# Patient Record
Sex: Female | Born: 1945 | Race: Black or African American | Hispanic: No | Marital: Single | Smoking: Never smoker
Health system: Southern US, Community
[De-identification: ages and names within clinical notes are randomized; demographics above are authoritative.]

## PROBLEM LIST (undated history)

## (undated) DIAGNOSIS — I10 Essential (primary) hypertension: Secondary | ICD-10-CM

## (undated) DIAGNOSIS — E7800 Pure hypercholesterolemia, unspecified: Secondary | ICD-10-CM

## (undated) DIAGNOSIS — I519 Heart disease, unspecified: Secondary | ICD-10-CM

## (undated) DIAGNOSIS — B20 Human immunodeficiency virus [HIV] disease: Secondary | ICD-10-CM

## (undated) DIAGNOSIS — G4730 Sleep apnea, unspecified: Secondary | ICD-10-CM

## (undated) DIAGNOSIS — G8929 Other chronic pain: Secondary | ICD-10-CM

## (undated) DIAGNOSIS — M549 Dorsalgia, unspecified: Secondary | ICD-10-CM

## (undated) DIAGNOSIS — M4800 Spinal stenosis, site unspecified: Secondary | ICD-10-CM

## (undated) DIAGNOSIS — F418 Other specified anxiety disorders: Secondary | ICD-10-CM

## (undated) DIAGNOSIS — M1990 Unspecified osteoarthritis, unspecified site: Secondary | ICD-10-CM

## (undated) DIAGNOSIS — K219 Gastro-esophageal reflux disease without esophagitis: Secondary | ICD-10-CM

## (undated) DIAGNOSIS — E119 Type 2 diabetes mellitus without complications: Secondary | ICD-10-CM

## (undated) DIAGNOSIS — J45909 Unspecified asthma, uncomplicated: Secondary | ICD-10-CM

## (undated) DIAGNOSIS — E039 Hypothyroidism, unspecified: Secondary | ICD-10-CM

## (undated) DIAGNOSIS — F209 Schizophrenia, unspecified: Secondary | ICD-10-CM

## (undated) DIAGNOSIS — Z21 Asymptomatic human immunodeficiency virus [HIV] infection status: Secondary | ICD-10-CM

## (undated) HISTORY — DX: Essential (primary) hypertension: I10

## (undated) HISTORY — DX: Asymptomatic human immunodeficiency virus (hiv) infection status: Z21

## (undated) HISTORY — DX: Heart disease, unspecified: I51.9

## (undated) HISTORY — DX: Pure hypercholesterolemia, unspecified: E78.00

## (undated) HISTORY — DX: Type 2 diabetes mellitus without complications: E11.9

## (undated) HISTORY — DX: Schizophrenia, unspecified: F20.9

## (undated) HISTORY — DX: Unspecified asthma, uncomplicated: J45.909

## (undated) HISTORY — DX: Gastro-esophageal reflux disease without esophagitis: K21.9

## (undated) HISTORY — DX: Hypothyroidism, unspecified: E03.9

## (undated) HISTORY — DX: Sleep apnea, unspecified: G47.30

## (undated) HISTORY — PX: CORONARY ANGIOPLASTY WITH STENT PLACEMENT: SHX49

## (undated) HISTORY — PX: TUBAL LIGATION: SHX77

## (undated) HISTORY — PX: HERNIA REPAIR: SHX51

## (undated) HISTORY — DX: Human immunodeficiency virus (HIV) disease: B20

## (undated) HISTORY — DX: Other specified anxiety disorders: F41.8

---

## 2014-10-10 ENCOUNTER — Encounter: Payer: Self-pay | Admitting: Internal Medicine

## 2014-11-01 ENCOUNTER — Ambulatory Visit (INDEPENDENT_AMBULATORY_CARE_PROVIDER_SITE_OTHER): Payer: Medicare Other | Admitting: Gastroenterology

## 2014-11-01 ENCOUNTER — Other Ambulatory Visit: Payer: Self-pay

## 2014-11-01 ENCOUNTER — Encounter (INDEPENDENT_AMBULATORY_CARE_PROVIDER_SITE_OTHER): Payer: Self-pay

## 2014-11-01 ENCOUNTER — Encounter: Payer: Self-pay | Admitting: Gastroenterology

## 2014-11-01 VITALS — BP 142/85 | HR 97 | Temp 97.6°F | Ht 65.0 in | Wt 235.0 lb

## 2014-11-01 DIAGNOSIS — R112 Nausea with vomiting, unspecified: Secondary | ICD-10-CM | POA: Diagnosis not present

## 2014-11-01 DIAGNOSIS — R197 Diarrhea, unspecified: Secondary | ICD-10-CM | POA: Diagnosis not present

## 2014-11-01 MED ORDER — ONDANSETRON HCL 4 MG PO TABS: 4.0000 mg | ORAL_TABLET | Freq: Three times a day (TID) | ORAL | Status: DC

## 2014-11-01 MED ORDER — ELUXADOLINE 75 MG PO TABS: 1.0000 | ORAL_TABLET | Freq: Two times a day (BID) | ORAL | Status: DC

## 2014-11-01 NOTE — Progress Notes (Signed)
Primary Care Physician:  Aggie CosierBALAJI DESAI, MD Primary Gastroenterologist:  Dr. Jena Gaussourk   Chief Complaint  Patient presents with  . Nausea  . Diarrhea  . Emesis    HPI:   Karen Mueller is a 69 y.o. female presenting today at the request of her primary care physician secondary to persistent nausea.   N/V for approximately a year. Hx of IBS. Diarrhea predominant. Colonoscopy in 2010 by Dr. Samuella CotaPandya. EGD several years ago as well. Intermittent symptoms. Gets nauseated moreso in the evenings. Solid food dysphagia, chronic. No weight loss. Appetite good. If doesn't eat in the morning, goes on eating spree in the evenings. When gets nervous or excited, food keeps coming up. Dexilant once daily. Ibuprofen routinely for pain.   Lower abdominal cramping associated with loose stools, improved with defecation. CHRONIC. No rectal bleeding. Diarrhea worsens after getting upset or excited. Diarrhea hits "anytime". Hardly ever a solid stool. Imodium has helped with loose stools in the past.  Past Medical History  Diagnosis Date  . Diabetes   . Hypercholesterolemia   . Hypertension   . GERD (gastroesophageal reflux disease)   . Sleep apnea   . Asthma   . Heart disease   . HIV (human immunodeficiency virus infection)   . Hypothyroidism     Past Surgical History  Procedure Laterality Date  . Coronary angioplasty with stent placement      X 2  . Tubal ligation    . Hernia repair      Current Outpatient Prescriptions  Medication Sig Dispense Refill  . benztropine (COGENTIN) 1 MG tablet 1 mg    . dexlansoprazole (DEXILANT) 60 MG capsule Take by mouth.    . hydrOXYzine (VISTARIL) 50 MG capsule 50 mg    . ibuprofen (ADVIL,MOTRIN) 800 MG tablet Take 800 mg by mouth every 8 (eight) hours as needed.    . loperamide (IMODIUM) 2 MG capsule Take 2 mg by mouth as needed for diarrhea or loose stools.    . metFORMIN (GLUCOPHAGE) 500 MG tablet     . metoprolol tartrate (LOPRESSOR) 25 MG tablet Take  by mouth.    . mirabegron ER (MYRBETRIQ) 50 MG TB24 tablet Take 50 mg by mouth daily.    . QUEtiapine (SEROQUEL) 200 MG tablet Take with a 400 mg tablet bedtime    . QUEtiapine (SEROQUEL) 400 MG tablet Take 400 mg by mouth nightly as needed,    . ramelteon (ROZEREM) 8 MG tablet Take by mouth.    . sertraline (ZOLOFT) 25 MG tablet     . sitaGLIPtin (JANUVIA) 100 MG tablet Take 100 mg by mouth daily.    . SUSTIVA 600 MG tablet     . tiZANidine (ZANAFLEX) 4 MG tablet Take by mouth.    . Eluxadoline (VIBERZI) 75 MG TABS Take 1 tablet by mouth 2 (two) times daily with a meal. 60 tablet 3  . ondansetron (ZOFRAN) 4 MG tablet Take 1 tablet (4 mg total) by mouth 3 (three) times daily before meals. 90 tablet 3   No current facility-administered medications for this visit.    Allergies as of 11/01/2014 - Review Complete 11/01/2014  Allergen Reaction Noted  . Metronidazole Other (See Comments) 11/01/2014  . Other Other (See Comments) 11/01/2014    Family History  Problem Relation Age of Onset  . Colon cancer Neg Hx     History   Social History  . Marital Status: Unknown    Spouse Name: N/A  .  Number of Children: N/A  . Years of Education: N/A   Occupational History  . Not on file.   Social History Main Topics  . Smoking status: Never Smoker   . Smokeless tobacco: Not on file  . Alcohol Use: No  . Drug Use: No  . Sexual Activity: Not on file   Other Topics Concern  . Not on file   Social History Narrative  . No narrative on file    Review of Systems: Gen: see HPI CV: Denies chest pain, heart palpitations, peripheral edema, syncope.  Resp: +SOB, DOE GI: see HPI GU : Denies urinary burning, urinary frequency, urinary hesitancy MS: +back pain, uses walker for ambulation Derm: Denies rash, itching, dry skin Psych: +depression/anxiety Heme: Denies bruising, bleeding, and enlarged lymph nodes.  Physical Exam: BP 142/85 mmHg  Pulse 97  Temp(Src) 97.6 F (36.4 C) (Oral)   Ht 5\' 5"  (1.651 m)  Wt 235 lb (106.595 kg)  BMI 39.11 kg/m2 General:   Alert and oriented. Pleasant and cooperative. Well-nourished and well-developed.  Head:  Normocephalic and atraumatic. Eyes:  Without icterus, sclera clear and conjunctiva pink.  Ears:  Normal auditory acuity. Nose:  No deformity, discharge,  or lesions. Mouth:  Upper palate edentulous  Lungs:  Clear to auscultation bilaterally. No wheezes, rales, or rhonchi. No distress.  Heart:  S1, S2 present without murmurs appreciated.  Abdomen:  +BS, soft, non-tender and non-distended. No HSM noted. No guarding or rebound. No masses appreciated. Obese.  Rectal:  Deferred  Msk:  Symmetrical without gross deformities. Normal posture. Extremities:  Without  edema. Neurologic:  Alert and  oriented x4;  grossly normal neurologically. Skin:  Intact without significant lesions or rashes. Psych:  Alert and cooperative. Normal mood and affect.

## 2014-11-01 NOTE — Patient Instructions (Addendum)
Start taking Viberzi twice a day with food. We have provided samples and sent a prescription. This is for abdominal cramping, diarrhea.   Start taking Zofran scheduled 20-30 minutes before your meal. This is a nausea medication. Please review the diet attached.   We have scheduled you for an upper endoscopy with dilation with Dr. Jena Gaussourk in the near future.   I would like to see you back in 3 months.

## 2014-11-06 ENCOUNTER — Telehealth: Payer: Self-pay

## 2014-11-06 NOTE — Telephone Encounter (Signed)
Received a fax from the pharmacy. pts insurance will not cover viberzi. It is a non-formulary drug.

## 2014-11-06 NOTE — Telephone Encounter (Signed)
Pt is calling because the Viberzi is making her vomit. SHe does not want to take anymore since she is throwing up.Please advise

## 2014-11-07 MED ORDER — DICYCLOMINE HCL 10 MG PO CAPS: 10.0000 mg | ORAL_CAPSULE | Freq: Three times a day (TID) | ORAL | Status: DC

## 2014-11-07 NOTE — Telephone Encounter (Signed)
Pt is better since stopping the Viberzi. She is aware of the new Rx for the diarrhea

## 2014-11-07 NOTE — Telephone Encounter (Signed)
Note faxed to the pharmacy.

## 2014-11-07 NOTE — Telephone Encounter (Signed)
Stop Viberzi now.   If she still has vomiting after stopping, needs stat CMP, lipase. Any abdominal pain?   Zofran scheduled with meals as per last appt. Will send in Bentyl to take with meals for diarrhea.

## 2014-11-07 NOTE — Telephone Encounter (Signed)
Stopped Viberzi. Patient having N/V.

## 2014-11-10 DIAGNOSIS — A0472 Enterocolitis due to Clostridium difficile, not specified as recurrent: Secondary | ICD-10-CM | POA: Insufficient documentation

## 2014-11-10 DIAGNOSIS — R112 Nausea with vomiting, unspecified: Secondary | ICD-10-CM | POA: Insufficient documentation

## 2014-11-10 NOTE — Assessment & Plan Note (Signed)
69 year old female wit90h nausea and vomiting for approximately a year in the setting of diabetes, raising the question of delayed gastric emptying. Associated solid food dysphagia, chronic, with last EGD several years ago by Dr. Samuella CotaPandya. Unknown if any previous dilations performed. Query uncontrolled GERD, web, ring, or stricture. Doubt occult malignancy in this setting.  Proceed with upper endoscopy and dilation  in the near future with Dr. Jena Gaussourk. The risks, benefits, and alternatives have been discussed in detail with patient. They have stated understanding and desire to proceed.  PROPOFOL due to polypharmacy Continue Dexilant once daily

## 2014-11-10 NOTE — Assessment & Plan Note (Signed)
Chronic, with history of IBS. Last colonoscopy several years ago by Dr. Samuella CotaPandya. No concerning lower GI symptoms. Will retrieve last colonoscopy reports and trial Viberzi 75 mg BID.

## 2014-11-15 ENCOUNTER — Encounter (HOSPITAL_COMMUNITY)
Admission: RE | Admit: 2014-11-15 | Discharge: 2014-11-15 | Disposition: A | Discharge status: Home / Self Care | Payer: Medicare Other | Source: Ambulatory Visit | Attending: Internal Medicine | Admitting: Internal Medicine

## 2014-11-18 ENCOUNTER — Telehealth: Payer: Self-pay

## 2014-11-18 NOTE — Telephone Encounter (Signed)
Karen JonesCarolyn called because she did not show for Prep-Op appointment.They tried to call her but the numbers in the computer are not working. I tried to call also but no answer.

## 2014-11-19 NOTE — Telephone Encounter (Signed)
Noted. Will need to cancel procedure for now and have her come in for an OV.

## 2014-11-19 NOTE — Telephone Encounter (Signed)
I have tried to call but still no answer

## 2014-11-21 ENCOUNTER — Ambulatory Visit (HOSPITAL_COMMUNITY): Admission: RE | Admit: 2014-11-21 | Payer: Medicare Other | Source: Ambulatory Visit | Admitting: Internal Medicine

## 2014-11-21 ENCOUNTER — Encounter (HOSPITAL_COMMUNITY): Admission: RE | Payer: Self-pay | Source: Ambulatory Visit

## 2014-11-21 SURGERY — ESOPHAGOGASTRODUODENOSCOPY (EGD) WITH PROPOFOL
Anesthesia: Monitor Anesthesia Care

## 2014-11-29 NOTE — Progress Notes (Signed)
cc'ed to pcp °

## 2014-12-31 ENCOUNTER — Ambulatory Visit (INDEPENDENT_AMBULATORY_CARE_PROVIDER_SITE_OTHER): Payer: Medicare Other | Admitting: Gastroenterology

## 2014-12-31 ENCOUNTER — Other Ambulatory Visit: Payer: Self-pay

## 2014-12-31 ENCOUNTER — Encounter: Payer: Self-pay | Admitting: Gastroenterology

## 2014-12-31 VITALS — BP 126/77 | HR 97 | Temp 97.3°F | Ht 65.0 in | Wt 227.4 lb

## 2014-12-31 DIAGNOSIS — R197 Diarrhea, unspecified: Secondary | ICD-10-CM | POA: Diagnosis not present

## 2014-12-31 DIAGNOSIS — R112 Nausea with vomiting, unspecified: Secondary | ICD-10-CM

## 2014-12-31 DIAGNOSIS — K625 Hemorrhage of anus and rectum: Secondary | ICD-10-CM | POA: Diagnosis not present

## 2014-12-31 MED ORDER — PEG 3350-KCL-NA BICARB-NACL 420 G PO SOLR: 4000.0000 mL | ORAL | Status: DC

## 2014-12-31 NOTE — Assessment & Plan Note (Addendum)
69 year old female with intermittent N/V for the past year in the setting of diabetes, raising the question of delayed gastric emptying. Associated solid food dysphagia, chronic, with reportedly an EGD several years ago by Dr. Samuella CotaPandya. Requesting records again. Poor historian. Query uncontrolled GERD, web, ring, stricture. Doubt occult malignancy as she has no other concerning symptoms. May ultimately need GES. Ibuprofen routinely may be contributing to clinical picture, leading to gastritis, +/- PUD.   Proceed with upper endoscopy and dilation in the near future with Dr. Jena Gaussourk. The risks, benefits, and alternatives have been discussed in detail with patient. They have stated understanding and desire to proceed.  PROPOFOL DUE TO POLYPHARMACY Continue Dexilant once daily Consider GES Patient is on plavix

## 2014-12-31 NOTE — Assessment & Plan Note (Signed)
Chronic, reported history of IBS. Now with rectal bleeding. Intolerant to [employer], stating it worsened N/V. Continue Bentyl, which has only shown minimal improvement. Colonoscopy as planned. Hold on stool studies as this is chronic and unchanged. Further recommendations after colonoscopy.

## 2014-12-31 NOTE — Assessment & Plan Note (Addendum)
In setting of chronic diarrhea. Last colonoscopy several years ago by Dr. Samuella CotaPandya per patient; however, she is a poor historian. Unable to retreive any records. Due to new onset rectal bleeding, will proceed with colonoscopy at time of EGD. Likely benign anorectal source.  Proceed with TCS with Dr. Jena Gaussourk in near future: the risks, benefits, and alternatives have been discussed with the patient in detail. The patient states understanding and desires to proceed. PROPOFOL DUE TO POLYPHARMACY Patient is on plavix. Continue as risks of stopping plavix outweigh benefits.

## 2014-12-31 NOTE — Patient Instructions (Signed)
We have scheduled you for a colonoscopy, upper endoscopy, and dilation with Dr. Jena Gaussourk in the near future.   Please continue Dexilant once daily each morning for reflux and Bentyl with meals and bedtime for loose stool and belly cramping.   Further recommendations to follow!

## 2014-12-31 NOTE — Progress Notes (Signed)
Referring Provider: Aggie Cosieresai, Balaji, MD Primary Care Physician:  Aggie CosierBALAJI DESAI, MD  Primary GI: Dr. Jena Gaussourk   Chief Complaint  Patient presents with  . set up EGD    HPI:   Karen Mueller is a 69 y.o. female presenting today with a history of IBS, diarrhea predominant, with last colonoscopy reportedly in 2010 by Dr. Samuella CotaPandya. We have requested records. No concerning lower GI symptoms. Notable history of N/V for the past year. Last seen in May 2016 and scheduled for an EGD and dilation; however, she did not show for her pre-op appointment. Procedure was cancelled. [facility]. Missed her medications. Hallucinating at that time. History of schizophrenia. Doing well now.   States appetite is good. Nausea worse in the evenings. Intermittent N/V, sometimes "food comes up if excited". Dexilant once daily. Ibuprofen routinely for pain. Solid food dysphagia noted, chronic. Possible EGD several years ago by Dr. Samuella CotaPandya. Records requested.    Unable to tolerate Viberzi, stating it worsened N/V. Started on Bentyl. States "none of it is helping". Chronic diarrhea. States she has seen some low-volume hematochezia intermittently, which is new since last appt. Vague abdominal discomfort, crampy. Poor historian. Relieved with defecation.   Past Medical History  Diagnosis Date  . Diabetes   . Hypercholesterolemia   . Hypertension   . GERD (gastroesophageal reflux disease)   . Sleep apnea   . Asthma   . Heart disease   . HIV (human immunodeficiency virus infection)   . Hypothyroidism   . Schizophrenia   . Depression with anxiety     Past Surgical History  Procedure Laterality Date  . Coronary angioplasty with stent placement      X 2  . Tubal ligation    . Hernia repair      Current Outpatient Prescriptions  Medication Sig Dispense Refill  . aspirin 81 MG tablet Take 81 mg by mouth daily.    . benztropine (COGENTIN) 1 MG tablet 1 mg    .  dexlansoprazole (DEXILANT) 60 MG capsule Take by mouth.    . dicyclomine (BENTYL) 10 MG capsule Take 1 capsule (10 mg total) by mouth 4 (four) times daily -  before meals and at bedtime. 120 capsule 3  . hydrOXYzine (VISTARIL) 50 MG capsule 50 mg    . ibuprofen (ADVIL,MOTRIN) 800 MG tablet Take 800 mg by mouth every 8 (eight) hours as needed.    . loperamide (IMODIUM) 2 MG capsule Take 2 mg by mouth as needed for diarrhea or loose stools.    . metFORMIN (GLUCOPHAGE) 500 MG tablet     . metoprolol tartrate (LOPRESSOR) 25 MG tablet Take by mouth.    . mirabegron ER (MYRBETRIQ) 50 MG TB24 tablet Take 50 mg by mouth daily.    . Multiple Vitamins-Minerals (CERTA PLUS) TABS Take 1 tablet by mouth daily.    . QUEtiapine (SEROQUEL) 200 MG tablet Take with a 400 mg tablet bedtime    . QUEtiapine (SEROQUEL) 400 MG tablet Take 400 mg by mouth nightly as needed,    . sertraline (ZOLOFT) 25 MG tablet     . sitaGLIPtin (JANUVIA) 100 MG tablet Take 100 mg by mouth daily.    . SUSTIVA 600 MG tablet     . tiZANidine (ZANAFLEX) 4 MG tablet Take by mouth.    . ondansetron (ZOFRAN) 4 MG tablet Take 1 tablet (4 mg total) by mouth 3 (three) times daily before meals. (Patient  not taking: Reported on 12/31/2014) 90 tablet 3  . ramelteon (ROZEREM) 8 MG tablet Take by mouth.     No current facility-administered medications for this visit.    Allergies as of 12/31/2014 - Review Complete 12/31/2014  Allergen Reaction Noted  . Metronidazole Other (See Comments) 11/01/2014  . Other Other (See Comments) 11/01/2014    Family History  Problem Relation Age of Onset  . Colon cancer Neg Hx     History   Social History  . Marital Status: Single    Spouse Name: N/A  . Number of Children: N/A  . Years of Education: N/A   Social History Main Topics  . Smoking status: Never Smoker   . Smokeless tobacco: Not on file  . Alcohol Use: No  . Drug Use: No  . Sexual Activity: Not on file   Other Topics Concern  . None    Social History Narrative    Review of Systems: Gen: see HPI CV: occasional palpitations Resp: +Sob, +DOE GI: see HPI MS: +back pain, uses walker for ambulation Derm: Denies rash, itching, dry skin Psych: +depression/anxiety Heme: Denies bruising, bleeding, and enlarged lymph nodes.  Physical Exam: BP 126/77 mmHg  Pulse 97  Temp(Src) 97.3 F (36.3 C)  Ht  (1.651 m)  Wt 227 lb 6.4 oz (103.148 kg)  BMI 37.84 kg/m2 General:   Alert and oriented. No distress noted. Pleasant and cooperative.  Head:  Normocephalic and atraumatic. Eyes:  Conjuctiva clear without scleral icterus. Mouth:  Oral mucosa pink and moist. Edentulous upper palate Heart:  S1, S2 present without murmurs, rubs, or gallops. Regular rate and rhythm. Abdomen:  +BS, soft, mild TTP umbilicus, query umbilical hernia.  and non-distended. No rebound or guarding. Difficult to appreciate any HSM due to large AP diameter.  Msk:  Symmetrical without gross deformities. Stooped posture due to chronic back pain, knee pain.  Extremities:  Without edema. Neurologic:  Alert and  oriented x4;  grossly normal neurologically. Psych:  Alert and cooperative. Normal mood and affect.

## 2014-12-31 NOTE — Progress Notes (Signed)
cc'ed to pcp °

## 2015-01-10 NOTE — Patient Instructions (Signed)
Isaly Fasching  01/10/2015     @PREFPERIOPPHARMACY @   Your procedure is scheduled on  7/21/206  Report to [facility] at  1130  A.M.  Call this number if you have problems the morning of surgery:  [phone]   Remember:  Do not eat food or drink liquids after midnight.  Take these medicines the morning of surgery with A SIP OF WATER : cogentin, dexilant, neurontin, vistaril,metoprolol, myrebetriq, singulair, zofran, zoloft, diovan, sustiva, zanaflex. Take your nebulizer and your inhaler before you come. DO NOT take any medicines for diabetes the morning of surgery.   Do not wear jewelry, make-up or nail polish.  Do not wear lotions, powders, or perfumes.    Do not shave 48 hours prior to surgery.  Men may shave face and neck.  Do not bring valuables to the hospital.  [facility] is not responsible for any belongings or valuables.  Contacts, dentures or bridgework may not be worn into surgery.  Leave your suitcase in the car.  After surgery it may be brought to your room.  For patients admitted to the hospital, discharge time will be determined by your treatment team.  Patients discharged the day of surgery will not be allowed to drive home.   Name and phone number of your driver:   family Special instructions:  Follow your prep and diet instructions given to you by Dr Luvenia Starch office.  Please read over the following fact sheets that you were given. Pain Booklet, Coughing and Deep Breathing, Surgical Site Infection Prevention, Anesthesia Post-op Instructions and Care and Recovery After Surgery      Esophagogastroduodenoscopy Esophagogastroduodenoscopy (EGD) is a procedure to examine the lining of the esophagus, stomach, and first part of the small intestine (duodenum). A long, flexible, lighted tube with a camera attached (endoscope) is inserted down the throat to view these organs. This procedure is done to detect problems or abnormalities, such as inflammation,  bleeding, ulcers, or growths, in order to treat them. The procedure lasts about 5-20 minutes. It is usually an outpatient procedure, but it may need to be performed in emergency cases in the hospital. LET YOUR CAREGIVER KNOW ABOUT:   Allergies to food or medicine.  All medicines you are taking, including vitamins, herbs, eyedrops, and over-the-counter medicines and creams.  Use of steroids (by mouth or creams).  Previous problems you or members of your family have had with the use of anesthetics.  Any blood disorders you have.  Previous surgeries you have had.  Other health problems you have.  Possibility of pregnancy, if this applies. RISKS AND COMPLICATIONS  Generally, EGD is a safe procedure. However, as with any procedure, complications can occur. Possible complications include:  Infection.  Bleeding.  Tearing (perforation) of the esophagus, stomach, or duodenum.  Difficulty breathing or not being able to breath.  Excessive sweating.  Spasms of the larynx.  Slowed heartbeat.  Low blood pressure. BEFORE THE PROCEDURE  Do not eat or drink anything for 6-8 hours before the procedure or as directed by your caregiver.  Ask your caregiver about changing or stopping your regular medicines.  If you wear dentures, be prepared to remove them before the procedure.  Arrange for someone to drive you home after the procedure. PROCEDURE   A vein will be accessed to give medicines and fluids. A medicine to relax you (sedative) and a pain reliever will be given through that access into the vein.  A numbing medicine (local anesthetic) may be sprayed on your throat for comfort and to stop you from gagging or coughing.  A mouth guard may be placed in your mouth to protect your teeth and to keep you from biting on the endoscope.  You will be asked to lie on your left side.  The endoscope is inserted down your throat and into the esophagus, stomach, and duodenum.  Air is put  through the endoscope to allow your caregiver to view the lining of your esophagus clearly.  The esophagus, stomach, and duodenum is then examined. During the exam, your caregiver may:  Remove tissue to be examined under a microscope (biopsy) for inflammation, infection, or other medical problems.  Remove growths.  Remove objects (foreign bodies) that are stuck.  Treat any bleeding with medicines or other devices that stop tissues from bleeding (hot cautery, clipping devices).  Widen (dilate) or stretch narrowed areas of the esophagus and stomach.  The endoscope will then be withdrawn. AFTER THE PROCEDURE  You will be taken to a recovery area to be monitored. You will be able to go home once you are stable and alert.  Do not eat or drink anything until the local anesthetic and numbing medicines have worn off. You may choke.  It is normal to feel bloated, have pain with swallowing, or have a sore throat for a short time. This will wear off.  Your caregiver should be able to discuss his or her findings with you. It will take longer to discuss the test results if any biopsies were taken. Document Released: 10/15/2004 Document Revised: 10/29/2013 Document Reviewed: 05/17/2012 [facility] Patient Information 2015 [place], [place]. This information is not intended to replace advice given to you by your health care provider. Make sure you discuss any questions you have with your health care provider. Esophageal Dilatation The esophagus is the long, narrow tube which carries food and liquid from the mouth to the stomach. Esophageal dilatation is the technique used to stretch a blocked or narrowed portion of the esophagus. This procedure is used when a part of the esophagus has become so narrow that it becomes difficult, painful or even impossible to swallow. This is generally an uncomplicated form of treatment. When this is not successful, chest surgery may be required. This is a much more extensive  form of treatment with a longer recovery time. CAUSES  Some of the more common causes of blockage or strictures of the esophagus are:  Narrowing from longstanding inflammation (soreness and redness) of the lower esophagus. This comes from the constant exposure of the lower esophagus to the acid which bubbles up from the stomach. Over time this causes scarring and narrowing of the lower esophagus.  Hiatal hernia in which a small part of the stomach bulges (herniates) up through the diaphragm. This can cause a gradual narrowing of the end of the esophagus.  Schatzki ring is a narrow ring of benign (non-cancerous) fibrous tissue which constricts the lower esophagus. The reason for this is not known.  Scleroderma is a connective tissue disorder that affects the esophagus and makes swallowing difficult.  Achalasia is an absence of nerves to the lower esophagus and to the esophageal sphincter. This is the circular muscle between the stomach and esophagus that relaxes to allow food into the stomach. After swallowing, it contracts to keep food in the stomach. This absence of nerves may be congenital (present since birth). This can cause irregular spasms of the lower esophageal muscle. This spasm does  not open up to allow food and fluid through. The result is a persistent blockage with subsequent slow trickling of the esophageal contents into the stomach.  Strictures may develop from swallowing materials which damage the esophagus. Some examples are strong acids or alkalis such as lye.  Growths such as benign (non-cancerous) and malignant (cancerous) tumors can block the esophagus.  Hereditary (present since birth) causes. DIAGNOSIS  Your caregiver often suspects this problem by taking a medical history. They will also do a physical exam. They can then prove their suspicions using X-rays and endoscopy. Endoscopy is an exam in which a tube like a small, flexible telescope is used to look at your esophagus.   TREATMENT There are different stretching (dilating) techniques that can be used. Simple bougie dilatation may be done in the office. This usually takes only a couple minutes. A numbing (anesthetic) spray of the throat is used. Endoscopy, when done, is done in an endoscopy suite under mild sedation. When fluoroscopy is used, the procedure is performed in X-ray. Other techniques require a little longer time. Recovery is usually quick. There is no waiting time to begin eating and drinking to test success of the treatment. Following are some of the methods used. Narrowing of the esophagus is treated by making it bigger. Commonly this is a mechanical problem which can be treated with stretching. This can be done in different ways. Your caregiver will discuss these with you. Some of the means used are:  A series of graduated (increasing thickness) flexible dilators can be used. These are weighted tubes passed through the esophagus into the stomach. The tubes used become progressively larger until the desired stretched size is reached. Graduated dilators are a simple and quick way of opening the esophagus. No visualization is required.  Another method is the use of endoscopy to place a flexible wire across the stricture. The endoscope is removed and the wire left in place. A dilator with a hole through it from end to end is guided down the esophagus and across the stricture. One or more of these dilators are passed over the wire. At the end of the exam, the wire is removed. This type of treatment may be performed in the X-ray department under fluoroscopy. An advantage of this procedure is the examiner is visualizing the end opening in the esophagus.  Stretching of the esophagus may be done using balloons. Deflated balloons are placed through the endoscope and across the stricture. This type of balloon dilatation is often done at the time of endoscopy or fluoroscopy. Flexible endoscopy allows the examiner to  directly view the stricture. A balloon is inserted in the deflated form into the area of narrowing. It is then inflated with air to a certain pressure that is preset for a given circumference. When inflated, it becomes sausage shaped, stretched, and makes the stricture larger.  Achalasia requires a longer, larger balloon-type dilator. This is frequently done under X-ray control. In this situation, the spastic muscle fibers in the lower esophagus are stretched. All of the above procedures make the passage of food and water into the stomach easier. They also make it easier for stomach contents to reflux back into the esophagus. Special medications may be used following the procedure to help prevent further stricturing. Proton-pump inhibitor medications are good at decreasing the amount of acid in the stomach juice. When stomach juice refluxes into the esophagus, the juice is no longer as acidic and is less likely to burn or scar the esophagus.  RISKS AND COMPLICATIONS Esophageal dilatation is usually performed effectively and without problems. Some complications that can occur are:  A small amount of bleeding almost always happens where the stretching takes place. If this is too excessive it may require more aggressive treatment.  An uncommon complication is perforation (making a hole) of the esophagus. The esophagus is thin. It is easy to make a hole in it. If this happens, an operation may be necessary to repair this.  A small, undetected perforation could lead to an infection in the chest. This can be very serious. HOME CARE INSTRUCTIONS   If you received sedation for your procedure, do not drive, make important decisions, or perform any activities requiring your full coordination. Do not drink alcohol, take sedatives, or use any mind altering chemicals unless instructed by your caregiver.  You may use throat lozenges or warm salt water gargles if you have throat discomfort.  You can begin eating  and drinking normally on return home unless instructed otherwise. Do not purposely try to force large chunks of food down to test the benefits of your procedure.  Mild discomfort can be eased with sips of ice water.  Medications for discomfort may or may not be needed. SEEK IMMEDIATE MEDICAL CARE IF:   You begin vomiting up blood.  You develop black, tarry stools.  You develop chills or an unexplained temperature of over 101F (38.3C)  You develop chest or abdominal pain.  You develop shortness of breath, or feel light-headed or faint.  Your swallowing is becoming more painful, difficult, or you are unable to swallow. MAKE SURE YOU:   Understand these instructions.  Will watch your condition.  Will get help right away if you are not doing well or get worse. Document Released: 08/05/2005 Document Revised: 10/29/2013 Document Reviewed: 09/22/2005 [facility] Patient Information 2015 [place], [place]. This information is not intended to replace advice given to you by your health care provider. Make sure you discuss any questions you have with your health care provider. Colonoscopy A colonoscopy is an exam to look at the entire large intestine (colon). This exam can help find problems such as tumors, polyps, inflammation, and areas of bleeding. The exam takes about 1 hour.  LET [facility] CARE PROVIDER KNOW ABOUT:   Any allergies you have.  All medicines you are taking, including vitamins, herbs, eye drops, creams, and over-the-counter medicines.  Previous problems you or members of your family have had with the use of anesthetics.  Any blood disorders you have.  Previous surgeries you have had.  Medical conditions you have. RISKS AND COMPLICATIONS  Generally, this is a safe procedure. However, as with any procedure, complications can occur. Possible complications include:  Bleeding.  Tearing or rupture of the colon wall.  Reaction to medicines given during the  exam.  Infection (rare). BEFORE THE PROCEDURE   Ask your health care provider about changing or stopping your regular medicines.  You may be prescribed an oral bowel prep. This involves drinking a large amount of medicated liquid, starting the day before your procedure. The liquid will cause you to have multiple loose stools until your stool is almost clear or light green. This cleans out your colon in preparation for the procedure.  Do not eat or drink anything else once you have started the bowel prep, unless your health care provider tells you it is safe to do so.  Arrange for someone to drive you home after the procedure. PROCEDURE   You will be given  medicine to help you relax (sedative).  You will lie on your side with your knees bent.  A long, flexible tube with a light and camera on the end (colonoscope) will be inserted through the rectum and into the colon. The camera sends video back to a computer screen as it moves through the colon. The colonoscope also releases carbon dioxide gas to inflate the colon. This helps your health care provider see the area better.  During the exam, your health care provider may take a small tissue sample (biopsy) to be examined under a microscope if any abnormalities are found.  The exam is finished when the entire colon has been viewed. AFTER THE PROCEDURE   Do not drive for 24 hours after the exam.  You may have a small amount of blood in your stool.  You may pass moderate amounts of gas and have mild abdominal cramping or bloating. This is caused by the gas used to inflate your colon during the exam.  Ask when your test results will be ready and how you will get your results. Make sure you get your test results. Document Released: 06/11/2000 Document Revised: 04/04/2013 Document Reviewed: 02/19/2013 [facility] Patient Information 2015 [place], [place]. This information is not intended to replace advice given to you by your health care  provider. Make sure you discuss any questions you have with your health care provider. PATIENT INSTRUCTIONS POST-ANESTHESIA  IMMEDIATELY FOLLOWING SURGERY:  Do not drive or operate machinery for the first twenty four hours after surgery.  Do not make any important decisions for twenty four hours after surgery or while taking narcotic pain medications or sedatives.  If you develop intractable nausea and vomiting or a severe headache please notify your doctor immediately.  FOLLOW-UP:  Please make an appointment with your surgeon as instructed. You do not need to follow up with anesthesia unless specifically instructed to do so.  WOUND CARE INSTRUCTIONS (if applicable):  Keep a dry clean dressing on the anesthesia/puncture wound site if there is drainage.  Once the wound has quit draining you may leave it open to air.  Generally you should leave the bandage intact for twenty four hours unless there is drainage.  If the epidural site drains for more than 36-48 hours please call the anesthesia department.  QUESTIONS?:  Please feel free to call your physician or the hospital operator if you have any questions, and they will be happy to assist you.

## 2015-01-13 ENCOUNTER — Encounter (HOSPITAL_COMMUNITY): Payer: Self-pay

## 2015-01-13 ENCOUNTER — Encounter (HOSPITAL_COMMUNITY)
Admission: RE | Admit: 2015-01-13 | Discharge: 2015-01-13 | Disposition: A | Discharge status: Home / Self Care | Payer: Medicare Other | Source: Ambulatory Visit | Attending: Internal Medicine | Admitting: Internal Medicine

## 2015-01-13 ENCOUNTER — Other Ambulatory Visit: Payer: Self-pay

## 2015-01-13 DIAGNOSIS — Q438 Other specified congenital malformations of intestine: Secondary | ICD-10-CM | POA: Diagnosis not present

## 2015-01-13 DIAGNOSIS — J45909 Unspecified asthma, uncomplicated: Secondary | ICD-10-CM | POA: Diagnosis not present

## 2015-01-13 DIAGNOSIS — E039 Hypothyroidism, unspecified: Secondary | ICD-10-CM | POA: Diagnosis not present

## 2015-01-13 DIAGNOSIS — K3189 Other diseases of stomach and duodenum: Secondary | ICD-10-CM | POA: Diagnosis not present

## 2015-01-13 DIAGNOSIS — E119 Type 2 diabetes mellitus without complications: Secondary | ICD-10-CM | POA: Diagnosis not present

## 2015-01-13 DIAGNOSIS — K449 Diaphragmatic hernia without obstruction or gangrene: Secondary | ICD-10-CM | POA: Diagnosis not present

## 2015-01-13 DIAGNOSIS — I10 Essential (primary) hypertension: Secondary | ICD-10-CM | POA: Diagnosis not present

## 2015-01-13 DIAGNOSIS — Z7982 Long term (current) use of aspirin: Secondary | ICD-10-CM | POA: Diagnosis not present

## 2015-01-13 DIAGNOSIS — K5730 Diverticulosis of large intestine without perforation or abscess without bleeding: Secondary | ICD-10-CM | POA: Diagnosis not present

## 2015-01-13 DIAGNOSIS — G4730 Sleep apnea, unspecified: Secondary | ICD-10-CM | POA: Diagnosis not present

## 2015-01-13 DIAGNOSIS — K219 Gastro-esophageal reflux disease without esophagitis: Secondary | ICD-10-CM | POA: Diagnosis not present

## 2015-01-13 DIAGNOSIS — K589 Irritable bowel syndrome without diarrhea: Secondary | ICD-10-CM | POA: Diagnosis not present

## 2015-01-13 DIAGNOSIS — F209 Schizophrenia, unspecified: Secondary | ICD-10-CM | POA: Diagnosis not present

## 2015-01-13 DIAGNOSIS — K921 Melena: Secondary | ICD-10-CM | POA: Diagnosis not present

## 2015-01-13 DIAGNOSIS — Z21 Asymptomatic human immunodeficiency virus [HIV] infection status: Secondary | ICD-10-CM | POA: Diagnosis not present

## 2015-01-13 DIAGNOSIS — F418 Other specified anxiety disorders: Secondary | ICD-10-CM | POA: Diagnosis not present

## 2015-01-13 DIAGNOSIS — R1310 Dysphagia, unspecified: Secondary | ICD-10-CM | POA: Diagnosis not present

## 2015-01-13 DIAGNOSIS — E780 Pure hypercholesterolemia: Secondary | ICD-10-CM | POA: Diagnosis not present

## 2015-01-13 DIAGNOSIS — Z79899 Other long term (current) drug therapy: Secondary | ICD-10-CM | POA: Diagnosis not present

## 2015-01-13 DIAGNOSIS — K648 Other hemorrhoids: Secondary | ICD-10-CM | POA: Diagnosis not present

## 2015-01-13 HISTORY — DX: Unspecified osteoarthritis, unspecified site: M19.90

## 2015-01-13 HISTORY — DX: Spinal stenosis, site unspecified: M48.00

## 2015-01-13 HISTORY — DX: Other chronic pain: G89.29

## 2015-01-13 HISTORY — DX: Dorsalgia, unspecified: M54.9

## 2015-01-13 LAB — BASIC METABOLIC PANEL
ANION GAP: 11 (ref 5–15)
BUN: 20 mg/dL (ref 6–20)
CO2: 22 mmol/L (ref 22–32)
CREATININE: 1.2 mg/dL — AB (ref 0.44–1.00)
Calcium: 9 mg/dL (ref 8.9–10.3)
Chloride: 103 mmol/L (ref 101–111)
GFR calc Af Amer: 53 mL/min — ABNORMAL LOW (ref >60)
GFR, EST NON AFRICAN AMERICAN: 46 mL/min — AB (ref >60)
Glucose, Bld: 207 mg/dL — ABNORMAL HIGH (ref 65–99)
Potassium: 4.7 mmol/L (ref 3.5–5.1)
SODIUM: 136 mmol/L (ref 135–145)

## 2015-01-13 LAB — CBC
HCT: 35.7 % — ABNORMAL LOW (ref 36.0–46.0)
Hemoglobin: 11.8 g/dL — ABNORMAL LOW (ref 12.0–15.0)
MCH: 28.6 pg (ref 26.0–34.0)
MCHC: 33.1 g/dL (ref 30.0–36.0)
MCV: 86.7 fL (ref 78.0–100.0)
PLATELETS: 305 10*3/uL (ref 150–400)
RBC: 4.12 MIL/uL (ref 3.87–5.11)
RDW: 15.3 % (ref 11.5–15.5)
WBC: 4.5 10*3/uL (ref 4.0–10.5)

## 2015-01-13 NOTE — Pre-Procedure Instructions (Signed)
Patient given information to sign up for my chart at home. 

## 2015-01-14 ENCOUNTER — Other Ambulatory Visit: Payer: Self-pay

## 2015-01-14 MED ORDER — FLEET ENEMA 7-19 GM/118ML RE ENEM: 1.0000 | ENEMA | Freq: Once | RECTAL | Status: DC

## 2015-01-14 MED ORDER — BISACODYL 5 MG PO TBEC: DELAYED_RELEASE_TABLET | ORAL | Status: DC

## 2015-01-14 MED ORDER — BISACODYL 5 MG PO TBEC: 5.0000 mg | DELAYED_RELEASE_TABLET | Freq: Every day | ORAL | Status: DC | PRN

## 2015-01-16 ENCOUNTER — Ambulatory Visit (HOSPITAL_COMMUNITY)
Admission: RE | Admit: 2015-01-16 | Discharge: 2015-01-16 | Disposition: A | Discharge status: Home / Self Care | Payer: Medicare Other | Source: Ambulatory Visit | Attending: Internal Medicine | Admitting: Internal Medicine

## 2015-01-16 ENCOUNTER — Ambulatory Visit (HOSPITAL_COMMUNITY): Payer: Medicare Other | Admitting: Anesthesiology

## 2015-01-16 ENCOUNTER — Encounter (HOSPITAL_COMMUNITY)
Admission: RE | Disposition: A | Discharge status: Home / Self Care | Payer: Self-pay | Source: Ambulatory Visit | Attending: Internal Medicine

## 2015-01-16 ENCOUNTER — Encounter (HOSPITAL_COMMUNITY): Payer: Self-pay

## 2015-01-16 DIAGNOSIS — G4730 Sleep apnea, unspecified: Secondary | ICD-10-CM | POA: Insufficient documentation

## 2015-01-16 DIAGNOSIS — E119 Type 2 diabetes mellitus without complications: Secondary | ICD-10-CM | POA: Insufficient documentation

## 2015-01-16 DIAGNOSIS — K449 Diaphragmatic hernia without obstruction or gangrene: Secondary | ICD-10-CM | POA: Diagnosis not present

## 2015-01-16 DIAGNOSIS — K589 Irritable bowel syndrome without diarrhea: Secondary | ICD-10-CM | POA: Insufficient documentation

## 2015-01-16 DIAGNOSIS — Z21 Asymptomatic human immunodeficiency virus [HIV] infection status: Secondary | ICD-10-CM | POA: Insufficient documentation

## 2015-01-16 DIAGNOSIS — F209 Schizophrenia, unspecified: Secondary | ICD-10-CM | POA: Insufficient documentation

## 2015-01-16 DIAGNOSIS — K219 Gastro-esophageal reflux disease without esophagitis: Secondary | ICD-10-CM | POA: Insufficient documentation

## 2015-01-16 DIAGNOSIS — K921 Melena: Secondary | ICD-10-CM | POA: Insufficient documentation

## 2015-01-16 DIAGNOSIS — K648 Other hemorrhoids: Secondary | ICD-10-CM | POA: Diagnosis not present

## 2015-01-16 DIAGNOSIS — K5730 Diverticulosis of large intestine without perforation or abscess without bleeding: Secondary | ICD-10-CM | POA: Diagnosis not present

## 2015-01-16 DIAGNOSIS — Z7982 Long term (current) use of aspirin: Secondary | ICD-10-CM | POA: Insufficient documentation

## 2015-01-16 DIAGNOSIS — K3189 Other diseases of stomach and duodenum: Secondary | ICD-10-CM | POA: Diagnosis not present

## 2015-01-16 DIAGNOSIS — Q438 Other specified congenital malformations of intestine: Secondary | ICD-10-CM | POA: Insufficient documentation

## 2015-01-16 DIAGNOSIS — K529 Noninfective gastroenteritis and colitis, unspecified: Secondary | ICD-10-CM | POA: Diagnosis not present

## 2015-01-16 DIAGNOSIS — R197 Diarrhea, unspecified: Secondary | ICD-10-CM

## 2015-01-16 DIAGNOSIS — F418 Other specified anxiety disorders: Secondary | ICD-10-CM | POA: Insufficient documentation

## 2015-01-16 DIAGNOSIS — E780 Pure hypercholesterolemia: Secondary | ICD-10-CM | POA: Insufficient documentation

## 2015-01-16 DIAGNOSIS — R1314 Dysphagia, pharyngoesophageal phase: Secondary | ICD-10-CM | POA: Diagnosis not present

## 2015-01-16 DIAGNOSIS — R1310 Dysphagia, unspecified: Secondary | ICD-10-CM | POA: Insufficient documentation

## 2015-01-16 DIAGNOSIS — Z79899 Other long term (current) drug therapy: Secondary | ICD-10-CM | POA: Insufficient documentation

## 2015-01-16 DIAGNOSIS — E039 Hypothyroidism, unspecified: Secondary | ICD-10-CM | POA: Insufficient documentation

## 2015-01-16 DIAGNOSIS — J45909 Unspecified asthma, uncomplicated: Secondary | ICD-10-CM | POA: Insufficient documentation

## 2015-01-16 DIAGNOSIS — I10 Essential (primary) hypertension: Secondary | ICD-10-CM | POA: Insufficient documentation

## 2015-01-16 HISTORY — PX: BIOPSY: SHX5522

## 2015-01-16 HISTORY — PX: MALONEY DILATION: SHX5535

## 2015-01-16 HISTORY — PX: COLONOSCOPY WITH PROPOFOL: SHX5780

## 2015-01-16 HISTORY — PX: ESOPHAGOGASTRODUODENOSCOPY (EGD) WITH PROPOFOL: SHX5813

## 2015-01-16 LAB — GLUCOSE, CAPILLARY
Glucose-Capillary: 172 mg/dL — ABNORMAL HIGH (ref 65–99)
Glucose-Capillary: 135 mg/dL — ABNORMAL HIGH (ref 65–99)

## 2015-01-16 SURGERY — COLONOSCOPY WITH PROPOFOL
Anesthesia: Monitor Anesthesia Care

## 2015-01-16 MED ORDER — WATER FOR IRRIGATION, STERILE IR SOLN
Status: DC | PRN
  Administered 2015-01-16: 1000 mL

## 2015-01-16 MED ORDER — GLYCOPYRROLATE 0.2 MG/ML IJ SOLN
0.2000 mg | Freq: Once | INTRAMUSCULAR | Status: AC
  Administered 2015-01-16: 0.2 mg via INTRAVENOUS

## 2015-01-16 MED ORDER — MIDAZOLAM HCL 2 MG/2ML IJ SOLN
INTRAMUSCULAR | Status: AC
  Filled 2015-01-16: qty 2

## 2015-01-16 MED ORDER — PROPOFOL 10 MG/ML IV BOLUS
INTRAVENOUS | Status: AC
  Filled 2015-01-16: qty 20

## 2015-01-16 MED ORDER — FENTANYL CITRATE (PF) 100 MCG/2ML IJ SOLN
INTRAMUSCULAR | Status: AC
  Filled 2015-01-16: qty 2

## 2015-01-16 MED ORDER — ONDANSETRON HCL 4 MG/2ML IJ SOLN
4.0000 mg | Freq: Once | INTRAMUSCULAR | Status: AC
  Administered 2015-01-16: 4 mg via INTRAVENOUS

## 2015-01-16 MED ORDER — MIDAZOLAM HCL 5 MG/5ML IJ SOLN
INTRAMUSCULAR | Status: DC | PRN
  Administered 2015-01-16 (×2): 1 mg via INTRAVENOUS

## 2015-01-16 MED ORDER — GLYCOPYRROLATE 0.2 MG/ML IJ SOLN
INTRAMUSCULAR | Status: AC
  Filled 2015-01-16: qty 1

## 2015-01-16 MED ORDER — LIDOCAINE HCL (CARDIAC) 10 MG/ML IV SOLN
INTRAVENOUS | Status: DC | PRN
  Administered 2015-01-16: 25 mg via INTRAVENOUS

## 2015-01-16 MED ORDER — FENTANYL CITRATE (PF) 100 MCG/2ML IJ SOLN
INTRAMUSCULAR | Status: DC | PRN
  Administered 2015-01-16 (×2): 50 ug via INTRAVENOUS

## 2015-01-16 MED ORDER — ONDANSETRON HCL 4 MG/2ML IJ SOLN
INTRAMUSCULAR | Status: AC
  Filled 2015-01-16: qty 2

## 2015-01-16 MED ORDER — MIDAZOLAM HCL 2 MG/2ML IJ SOLN
1.0000 mg | INTRAMUSCULAR | Status: DC | PRN
  Administered 2015-01-16: 2 mg via INTRAVENOUS

## 2015-01-16 MED ORDER — FENTANYL CITRATE (PF) 100 MCG/2ML IJ SOLN
25.0000 ug | INTRAMUSCULAR | Status: AC
  Administered 2015-01-16: 25 ug via INTRAVENOUS

## 2015-01-16 MED ORDER — FENTANYL CITRATE (PF) 100 MCG/2ML IJ SOLN: 25.0000 ug | INTRAMUSCULAR | Status: DC | PRN

## 2015-01-16 MED ORDER — ONDANSETRON HCL 4 MG/2ML IJ SOLN: 4.0000 mg | Freq: Once | INTRAMUSCULAR | Status: AC | PRN

## 2015-01-16 MED ORDER — PROPOFOL INFUSION 10 MG/ML OPTIME
INTRAVENOUS | Status: DC | PRN
  Administered 2015-01-16 (×2): via INTRAVENOUS
  Administered 2015-01-16: 150 ug/kg/min via INTRAVENOUS

## 2015-01-16 MED ORDER — STERILE WATER FOR IRRIGATION IR SOLN
Status: DC | PRN
  Administered 2015-01-16: 1000 mL

## 2015-01-16 MED ORDER — LACTATED RINGERS IV SOLN
INTRAVENOUS | Status: DC
  Administered 2015-01-16: 13:00:00 via INTRAVENOUS

## 2015-01-16 MED ORDER — LIDOCAINE HCL (PF) 1 % IJ SOLN
INTRAMUSCULAR | Status: AC
  Filled 2015-01-16: qty 5

## 2015-01-16 SURGICAL SUPPLY — 25 items
BLOCK BITE 60FR ADLT L/F BLUE (MISCELLANEOUS) ×3 IMPLANT
DEVICE CLIP HEMOSTAT 235CM (CLIP) IMPLANT
ELECT REM PT RETURN 9FT ADLT (ELECTROSURGICAL)
ELECTRODE REM PT RTRN 9FT ADLT (ELECTROSURGICAL) IMPLANT
FCP BXJMBJMB 240X2.8X (CUTTING FORCEPS)
FLOOR PAD 36X40 (MISCELLANEOUS) ×3
FORCEPS BIOP RAD 4 LRG CAP 4 (CUTTING FORCEPS) ×6 IMPLANT
FORCEPS BIOP RJ4 240 W/NDL (CUTTING FORCEPS)
FORCEPS BXJMBJMB 240X2.8X (CUTTING FORCEPS) IMPLANT
FORMALIN 10 PREFIL 20ML (MISCELLANEOUS) ×9 IMPLANT
INJECTOR/SNARE I SNARE (MISCELLANEOUS) IMPLANT
KIT ENDO PROCEDURE PEN (KITS) ×3 IMPLANT
MANIFOLD NEPTUNE II (INSTRUMENTS) ×3 IMPLANT
NEEDLE SCLEROTHERAPY 25GX240 (NEEDLE) IMPLANT
PAD FLOOR 36X40 (MISCELLANEOUS) ×1 IMPLANT
PROBE APC STR FIRE (PROBE) IMPLANT
PROBE INJECTION GOLD (MISCELLANEOUS)
PROBE INJECTION GOLD 7FR (MISCELLANEOUS) IMPLANT
SNARE ROTATE MED OVAL 20MM (MISCELLANEOUS) IMPLANT
SNARE SHORT THROW 13M SML OVAL (MISCELLANEOUS) ×3 IMPLANT
SYR 50ML LL SCALE MARK (SYRINGE) ×3 IMPLANT
SYR INFLATION 60ML (SYRINGE) IMPLANT
TRAP SPECIMEN MUCOUS 40CC (MISCELLANEOUS) IMPLANT
TUBING IRRIGATION ENDOGATOR (MISCELLANEOUS) ×3 IMPLANT
WATER STERILE IRR 1000ML POUR (IV SOLUTION) ×3 IMPLANT

## 2015-01-16 NOTE — Transfer of Care (Signed)
Immediate Anesthesia Transfer of Care Note  Patient: Marien Manship  Procedure(s) Performed: Procedure(s) with comments: COLONOSCOPY WITH PROPOFOL (N/A) - Cecum time in 1439  time out 1445  total time 6 Minutes ESOPHAGOGASTRODUODENOSCOPY (EGD) WITH PROPOFOL (N/A) - procedure 1 MALONEY DILATION (N/A) - 54/ BIOPSY (N/A) - Gastric, Ascending, Descending/Sigmoid  Patient Location: PACU  Anesthesia Type:MAC  Level of Consciousness: awake, alert , oriented and patient cooperative  Airway & Oxygen Therapy: Patient Spontanous Breathing and Patient connected to nasal cannula oxygen  Post-op Assessment: Report given to RN and Post -op Vital signs reviewed and stable  Post vital signs: Reviewed and stable  Last Vitals:  Filed Vitals:   01/16/15 1320  BP: 145/84  Pulse: 82  Temp:   Resp: 16    Complications: No apparent anesthesia complications

## 2015-01-16 NOTE — Anesthesia Preprocedure Evaluation (Signed)
Anesthesia Evaluation  Patient identified by MRN, date of birth, ID band Patient awake    Reviewed: Allergy & Precautions, NPO status , Patient's Chart, lab work & pertinent test results  Airway Mallampati: I  TM Distance: >3 FB     Dental  (+) Edentulous Upper, Partial Lower   Pulmonary asthma , sleep apnea ,  breath sounds clear to auscultation        Cardiovascular hypertension, Pt. on medications Rhythm:Regular Rate:Normal     Neuro/Psych PSYCHIATRIC DISORDERS Depression Schizophrenia    GI/Hepatic GERD-  Medicated,  Endo/Other  diabetes, Type 2Hypothyroidism   Renal/GU      Musculoskeletal   Abdominal   Peds  Hematology   Anesthesia Other Findings   Reproductive/Obstetrics                             Anesthesia Physical Anesthesia Plan  ASA: III  Anesthesia Plan: MAC   Post-op Pain Management:    Induction: Intravenous  Airway Management Planned: Simple Face Mask  Additional Equipment:   Intra-op Plan:   Post-operative Plan:   Informed Consent: I have reviewed the patients History and Physical, chart, labs and discussed the procedure including the risks, benefits and alternatives for the proposed anesthesia with the patient or authorized representative who has indicated his/her understanding and acceptance.     Plan Discussed with:   Anesthesia Plan Comments:         Anesthesia Quick Evaluation

## 2015-01-16 NOTE — Op Note (Signed)
[facility] [address] [place], [address]   COLONOSCOPY PROCEDURE REPORT  PATIENT: Karen, Mueller  MR#: [id number] BIRTHDATE: Jun 14, 1946 , 69  yrs. old GENDER: female ENDOSCOPIST: R.  Roetta Sessions, MD [facility] REFERRED BY: Dr. Celine Mans PROCEDURE DATE:  January 29, 2015 PROCEDURE:   Colonoscopy with biopsy INDICATIONS:Paper hematochezia; chronic diarrhea. MEDICATIONS: Deep sedation per Dr.  [facility] ASA CLASS:       Class III  CONSENT: The risks, benefits, alternatives and imponderables including but not limited to bleeding, perforation as well as the possibility of a missed lesion have been reviewed.  The potential for biopsy, lesion removal, etc. have also been discussed. Questions have been answered.  All parties agreeable.  Please see the history and physical in the medical record for more information.  DESCRIPTION OF PROCEDURE:   After the risks benefits and alternatives of the procedure were thoroughly explained, informed consent was obtained.  The digital rectal exam revealed no rectal mass.   The     endoscope was introduced through the anus and advanced to the cecum, which was identified by both the appendix and ileocecal valve. No adverse events experienced.   The quality of the prep was adequate  The instrument was then slowly withdrawn as the colon was fully examined. Estimated blood loss is zero unless otherwise noted in this procedure report.      COLON FINDINGS: Internal hemorrhoids; otherwise normal colon. Redundant colon.  Left-sided diverticulosis.  The remainder of the colonic mucosa appeared normal.  segmental biopsies of ascending and descending/sigmoid segments taken for histologic study.  External abdominal pressure and changing of the patient's position required to reach the cecum.  Retroflexion was performed. .  Withdrawal time=6 minutes 0 seconds.  The scope was withdrawn and the procedure completed. COMPLICATIONS:  There were no immediate complications. EBL 5 mL ENDOSCOPIC IMPRESSION: Redundant colon. Colonic diverticulosis?"status post segmental biopsy  RECOMMENDATIONS: Follow up on pathology. See EGD report.  eSigned:  R. Roetta Sessions, MD Jerrel [facility] 01/29/2015 3:01 PM   cc:  CPT CODES: ICD CODES:  The ICD and CPT codes recommended by this software are interpretations from the data that the clinical staff has captured with the software.  The verification of the translation of this report to the ICD and CPT codes and modifiers is the sole responsibility of the health care institution and practicing physician where this report was generated.  [employer] will not be held responsible for the validity of the ICD and CPT codes included on this report.  AMA assumes no liability for data contained or not contained herein. CPT is a Publishing rights manager of the [employer].

## 2015-01-16 NOTE — H&P (View-Only) (Signed)
cc'ed to pcp °
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
## 2015-01-16 NOTE — Discharge Instructions (Signed)
Colonoscopy Discharge Instructions  Read the instructions outlined below and refer to this sheet in the next few weeks. These discharge instructions provide you with general information on caring for yourself after you leave the hospital. Your doctor may also give you specific instructions. While your treatment has been planned according to the most current medical practices available, unavoidable complications occasionally occur. If you have any problems or questions after discharge, call Dr. Gala Romney at [phone]. ACTIVITY  You may resume your regular activity, but move at a slower pace for the next 24 hours.   Take frequent rest periods for the next 24 hours.   Walking will help get rid of the air and reduce the bloated feeling in your belly (abdomen).   No driving for 24 hours (because of the medicine (anesthesia) used during the test).    Do not sign any important legal documents or operate any machinery for 24 hours (because of the anesthesia used during the test).  NUTRITION  Drink plenty of fluids.   You may resume your normal diet as instructed by your doctor.   Begin with a light meal and progress to your normal diet. Heavy or fried foods are harder to digest and may make you feel sick to your stomach (nauseated).   Avoid alcoholic beverages for 24 hours or as instructed.  MEDICATIONS  You may resume your normal medications unless your doctor tells you otherwise.  WHAT YOU CAN EXPECT TODAY  Some feelings of bloating in the abdomen.   Passage of more gas than usual.   Spotting of blood in your stool or on the toilet paper.  IF YOU HAD POLYPS REMOVED DURING THE COLONOSCOPY:  No aspirin products for 7 days or as instructed.   No alcohol for 7 days or as instructed.   Eat a soft diet for the next 24 hours.  FINDING OUT THE RESULTS OF YOUR TEST Not all test results are available during your visit. If your test results are not back during the visit, make an appointment  with your caregiver to find out the results. Do not assume everything is normal if you have not heard from your caregiver or the medical facility. It is important for you to follow up on all of your test results.  SEEK IMMEDIATE MEDICAL ATTENTION IF:  You have more than a spotting of blood in your stool.   Your belly is swollen (abdominal distention).   You are nauseated or vomiting.   You have a temperature over 101.  You have abdominal pain or discomfort that is severe or gets worse throughout the day. EGD Discharge instructions Please read the instructions outlined below and refer to this sheet in the next few weeks. These discharge instructions provide you with general information on caring for yourself after you leave the hospital. Your doctor may also give you specific instructions. While your treatment has been planned according to the most current medical practices available, unavoidable complications occasionally occur. If you have any problems or questions after discharge, please call your doctor. ACTIVITY You may resume your regular activity but move at a slower pace for the next 24 hours.  Take frequent rest periods for the next 24 hours.  Walking will help expel (get rid of) the air and reduce the bloated feeling in your abdomen.  No driving for 24 hours (because of the anesthesia (medicine) used during the test).  You may shower.  Do not sign any important legal documents or operate any machinery for 24  hours (because of the anesthesia used during the test).  NUTRITION Drink plenty of fluids.  You may resume your normal diet.  Begin with a light meal and progress to your normal diet.  Avoid alcoholic beverages for 24 hours or as instructed by your caregiver.  MEDICATIONS You may resume your normal medications unless your caregiver tells you otherwise.  WHAT YOU CAN EXPECT TODAY You may experience abdominal discomfort such as a feeling of fullness or gas pains.   FOLLOW-UP Your doctor will discuss the results of your test with you.  SEEK IMMEDIATE MEDICAL ATTENTION IF ANY OF THE FOLLOWING OCCUR: Excessive nausea (feeling sick to your stomach) and/or vomiting.  Severe abdominal pain and distention (swelling).  Trouble swallowing.  Temperature over 101 F (37.8 C).  Rectal bleeding or vomiting of blood.    Your esophagus was dilated today. I took biopsies of your colon and stomach.  Further recommendations to follow after the pathology report has been reviewed.  Hemorrhoid and diverticulosis information providedDiverticulosis Diverticulosis is the condition that develops when small pouches (diverticula) form in the wall of your colon. Your colon, or large intestine, is where water is absorbed and stool is formed. The pouches form when the inside layer of your colon pushes through weak spots in the outer layers of your colon. CAUSES  No one knows exactly what causes diverticulosis. RISK FACTORS  Being older than 50. Your risk for this condition increases with age. Diverticulosis is rare in people younger than 40 years. By age 39, almost everyone has it.  Eating a low-fiber diet.  Being frequently constipated.  Being overweight.  Not getting enough exercise.  Smoking.  Taking over-the-counter pain medicines, like aspirin and ibuprofen. SYMPTOMS  Most people with diverticulosis do not have symptoms. DIAGNOSIS  Because diverticulosis often has no symptoms, health care providers often discover the condition during an exam for other colon problems. In many cases, a health care provider will diagnose diverticulosis while using a flexible scope to examine the colon (colonoscopy). TREATMENT  If you have never developed an infection related to diverticulosis, you may not need treatment. If you have had an infection before, treatment may include:  Eating more fruits, vegetables, and grains.  Taking a fiber supplement.  Taking a live bacteria  supplement (probiotic).  Taking medicine to relax your colon. HOME CARE INSTRUCTIONS   Drink at least 6-8 glasses of water each day to prevent constipation.  Try not to strain when you have a bowel movement.  Keep all follow-up appointments. If you have had an infection before:  Increase the fiber in your diet as directed by your health care provider or dietitian.  Take a dietary fiber supplement if your health care provider approves.  Only take medicines as directed by your health care provider. SEEK MEDICAL CARE IF:   You have abdominal pain.  You have bloating.  You have cramps.  You have not gone to the bathroom in 3 days. SEEK IMMEDIATE MEDICAL CARE IF:   Your pain gets worse.  Yourbloating becomes very bad.  You have a fever or chills, and your symptoms suddenly get worse.  You begin vomiting.  You have bowel movements that are bloody or black. MAKE SURE YOU:  Understand these instructions.  Will watch your condition.  Will get help right away if you are not doing well or get worse. Document Released: 03/11/2004 Document Revised: 06/19/2013 Document Reviewed: 05/09/2013 [facility] Patient Information 2015 [place], [place]. This information is not intended to replace advice  given to you by your health care provider. Make sure you discuss any questions you have with your health care provider. Hemorrhoids Hemorrhoids are swollen veins around the rectum or anus. There are two types of hemorrhoids:   Internal hemorrhoids. These occur in the veins just inside the rectum. They may poke through to the outside and become irritated and painful.  External hemorrhoids. These occur in the veins outside the anus and can be felt as a painful swelling or hard lump near the anus. CAUSES  Pregnancy.   Obesity.   Constipation or diarrhea.   Straining to have a bowel movement.   Sitting for long periods on the toilet.  Heavy lifting or other activity that caused  you to strain.  Anal intercourse. SYMPTOMS   Pain.   Anal itching or irritation.   Rectal bleeding.   Fecal leakage.   Anal swelling.   One or more lumps around the anus.  DIAGNOSIS  Your caregiver may be able to diagnose hemorrhoids by visual examination. Other examinations or tests that may be performed include:   Examination of the rectal area with a gloved hand (digital rectal exam).   Examination of anal canal using a small tube (scope).   A blood test if you have lost a significant amount of blood.  A test to look inside the colon (sigmoidoscopy or colonoscopy). TREATMENT Most hemorrhoids can be treated at home. However, if symptoms do not seem to be getting better or if you have a lot of rectal bleeding, your caregiver may perform a procedure to help make the hemorrhoids get smaller or remove them completely. Possible treatments include:   Placing a rubber band at the base of the hemorrhoid to cut off the circulation (rubber band ligation).   Injecting a chemical to shrink the hemorrhoid (sclerotherapy).   Using a tool to burn the hemorrhoid (infrared light therapy).   Surgically removing the hemorrhoid (hemorrhoidectomy).   Stapling the hemorrhoid to block blood flow to the tissue (hemorrhoid stapling).  HOME CARE INSTRUCTIONS   Eat foods with fiber, such as whole grains, beans, nuts, fruits, and vegetables. Ask your doctor about taking products with added fiber in them (fibersupplements).  Increase fluid intake. Drink enough water and fluids to keep your urine clear or pale yellow.   Exercise regularly.   Go to the bathroom when you have the urge to have a bowel movement. Do not wait.   Avoid straining to have bowel movements.   Keep the anal area dry and clean. Use wet toilet paper or moist towelettes after a bowel movement.   Medicated creams and suppositories may be used or applied as directed.   Only take over-the-counter or  prescription medicines as directed by your caregiver.   Take warm sitz baths for 15-20 minutes, 3-4 times a day to ease pain and discomfort.   Place ice packs on the hemorrhoids if they are tender and swollen. Using ice packs between sitz baths may be helpful.   Put ice in a plastic bag.   Place a towel between your skin and the bag.   Leave the ice on for 15-20 minutes, 3-4 times a day.   Do not use a donut-shaped pillow or sit on the toilet for long periods. This increases blood pooling and pain.  SEEK MEDICAL CARE IF:  You have increasing pain and swelling that is not controlled by treatment or medicine.  You have uncontrolled bleeding.  You have difficulty or you are unable to have  a bowel movement.  You have pain or inflammation outside the area of the hemorrhoids. MAKE SURE YOU:  Understand these instructions.  Will watch your condition.  Will get help right away if you are not doing well or get worse. Document Released: 06/11/2000 Document Revised: 05/31/2012 Document Reviewed: 04/18/2012 [facility] Patient Information 2015 [place], [place]. This information is not intended to replace advice given to you by your health care provider. Make sure you discuss any questions you have with your health care provider.

## 2015-01-16 NOTE — Anesthesia Procedure Notes (Signed)
Procedure Name: MAC Date/Time: 01/16/2015 1:26 PM Performed by: Pernell Dupre, Avianah Pellman A Pre-anesthesia Checklist: Patient identified, Timeout performed, Emergency Drugs available, Suction available and Patient being monitored Patient Re-evaluated:Patient Re-evaluated prior to inductionOxygen Delivery Method: Simple face mask

## 2015-01-16 NOTE — H&P (View-Only) (Signed)
Referring Provider: Aggie Cosieresai, Balaji, MD Primary Care Physician:  Aggie CosierBALAJI DESAI, MD  Primary GI: Dr. Jena Gaussourk   Chief Complaint  Patient presents with  . set up EGD    HPI:   Karen Cavalieratsy Downum is a 69 y.o. female presenting today with a history of IBS, diarrhea predominant, with last colonoscopy reportedly in 2010 by Dr. Samuella CotaPandya. We have requested records. No concerning lower GI symptoms. Notable history of N/V for the past year. Last seen in May 2016 and scheduled for an EGD and dilation; however, she did not show for her pre-op appointment. Procedure was cancelled. [facility]. Missed her medications. Hallucinating at that time. History of schizophrenia. Doing well now.   States appetite is good. Nausea worse in the evenings. Intermittent N/V, sometimes "food comes up if excited". Dexilant once daily. Ibuprofen routinely for pain. Solid food dysphagia noted, chronic. Possible EGD several years ago by Dr. Samuella CotaPandya. Records requested.    Unable to tolerate Viberzi, stating it worsened N/V. Started on Bentyl. States "none of it is helping". Chronic diarrhea. States she has seen some low-volume hematochezia intermittently, which is new since last appt. Vague abdominal discomfort, crampy. Poor historian. Relieved with defecation.   Past Medical History  Diagnosis Date  . Diabetes   . Hypercholesterolemia   . Hypertension   . GERD (gastroesophageal reflux disease)   . Sleep apnea   . Asthma   . Heart disease   . HIV (human immunodeficiency virus infection)   . Hypothyroidism   . Schizophrenia   . Depression with anxiety     Past Surgical History  Procedure Laterality Date  . Coronary angioplasty with stent placement      X 2  . Tubal ligation    . Hernia repair      Current Outpatient Prescriptions  Medication Sig Dispense Refill  . aspirin 81 MG tablet Take 81 mg by mouth daily.    . benztropine (COGENTIN) 1 MG tablet 1 mg    .  dexlansoprazole (DEXILANT) 60 MG capsule Take by mouth.    . dicyclomine (BENTYL) 10 MG capsule Take 1 capsule (10 mg total) by mouth 4 (four) times daily -  before meals and at bedtime. 120 capsule 3  . hydrOXYzine (VISTARIL) 50 MG capsule 50 mg    . ibuprofen (ADVIL,MOTRIN) 800 MG tablet Take 800 mg by mouth every 8 (eight) hours as needed.    . loperamide (IMODIUM) 2 MG capsule Take 2 mg by mouth as needed for diarrhea or loose stools.    . metFORMIN (GLUCOPHAGE) 500 MG tablet     . metoprolol tartrate (LOPRESSOR) 25 MG tablet Take by mouth.    . mirabegron ER (MYRBETRIQ) 50 MG TB24 tablet Take 50 mg by mouth daily.    . Multiple Vitamins-Minerals (CERTA PLUS) TABS Take 1 tablet by mouth daily.    . QUEtiapine (SEROQUEL) 200 MG tablet Take with a 400 mg tablet bedtime    . QUEtiapine (SEROQUEL) 400 MG tablet Take 400 mg by mouth nightly as needed,    . sertraline (ZOLOFT) 25 MG tablet     . sitaGLIPtin (JANUVIA) 100 MG tablet Take 100 mg by mouth daily.    . SUSTIVA 600 MG tablet     . tiZANidine (ZANAFLEX) 4 MG tablet Take by mouth.    . ondansetron (ZOFRAN) 4 MG tablet Take 1 tablet (4 mg total) by mouth 3 (three) times daily before meals. (Patient  not taking: Reported on 12/31/2014) 90 tablet 3  . ramelteon (ROZEREM) 8 MG tablet Take by mouth.     No current facility-administered medications for this visit.    Allergies as of 12/31/2014 - Review Complete 12/31/2014  Allergen Reaction Noted  . Metronidazole Other (See Comments) 11/01/2014  . Other Other (See Comments) 11/01/2014    Family History  Problem Relation Age of Onset  . Colon cancer Neg Hx     History   Social History  . Marital Status: Single    Spouse Name: N/A  . Number of Children: N/A  . Years of Education: N/A   Social History Main Topics  . Smoking status: Never Smoker   . Smokeless tobacco: Not on file  . Alcohol Use: No  . Drug Use: No  . Sexual Activity: Not on file   Other Topics Concern  . None    Social History Narrative    Review of Systems: Gen: see HPI CV: occasional palpitations Resp: +Sob, +DOE GI: see HPI MS: +back pain, uses walker for ambulation Derm: Denies rash, itching, dry skin Psych: +depression/anxiety Heme: Denies bruising, bleeding, and enlarged lymph nodes.  Physical Exam: BP 126/77 mmHg  Pulse 97  Temp(Src) 97.3 F (36.3 C)  Ht 5' 5" (1.651 m)  Wt 227 lb 6.4 oz (103.148 kg)  BMI 37.84 kg/m2 General:   Alert and oriented. No distress noted. Pleasant and cooperative.  Head:  Normocephalic and atraumatic. Eyes:  Conjuctiva clear without scleral icterus. Mouth:  Oral mucosa pink and moist. Edentulous upper palate Heart:  S1, S2 present without murmurs, rubs, or gallops. Regular rate and rhythm. Abdomen:  +BS, soft, mild TTP umbilicus, query umbilical hernia.  and non-distended. No rebound or guarding. Difficult to appreciate any HSM due to large AP diameter.  Msk:  Symmetrical without gross deformities. Stooped posture due to chronic back pain, knee pain.  Extremities:  Without edema. Neurologic:  Alert and  oriented x4;  grossly normal neurologically. Psych:  Alert and cooperative. Normal mood and affect.  

## 2015-01-16 NOTE — OR Nursing (Signed)
Patient stated she took all of her prep, bowels yellowish no solid pieces.  Patient stated she did not take her medications today but took them all yesterday.  Her pill case supports her claim.

## 2015-01-16 NOTE — Anesthesia Postprocedure Evaluation (Signed)
  Anesthesia Post-op Note  Patient: Karen Mueller  Procedure(s) Performed: Procedure(s) with comments: COLONOSCOPY WITH PROPOFOL (N/A) - Cecum time in 1439  time out 1445  total time 6 Minutes ESOPHAGOGASTRODUODENOSCOPY (EGD) WITH PROPOFOL (N/A) - procedure 1 MALONEY DILATION (N/A) - 54/ BIOPSY (N/A) - Gastric, Ascending, Descending/Sigmoid  Patient Location: PACU  Anesthesia Type:MAC  Level of Consciousness: awake, alert , oriented and patient cooperative  Airway and Oxygen Therapy: Patient Spontanous Breathing and Patient connected to nasal cannula oxygen  Post-op Pain: none  Post-op Assessment: Post-op Vital signs reviewed and Patient's Cardiovascular Status Stable              Post-op Vital Signs: Reviewed and stable  Last Vitals:  Filed Vitals:   01/16/15 1320  BP: 145/84  Pulse: 82  Temp:   Resp: 16    Complications: No apparent anesthesia complications

## 2015-01-16 NOTE — Op Note (Signed)
[facility] [address] [place], [address]   ENDOSCOPY PROCEDURE REPORT  PATIENT: Karen Mueller, Karen Mueller  MR#: [id number] BIRTHDATE: 11-03-1945 , 69  yrs. old GENDER: female ENDOSCOPIST: R.  Roetta Sessions, MD [facility] REFERRED BY:  Dr. Celine Mans PROCEDURE DATE:  2015-02-15 PROCEDURE:  EGD w/ biopsy and Maloney dilation of esophagus INDICATIONS:  Dysphagia. MEDICATIONS: Deep sedation per Dr.  [facility] ASA CLASS:      Class III  CONSENT: The risks, benefits, limitations, alternatives and imponderables have been discussed.  The potential for biopsy, esophogeal dilation, etc. have also been reviewed.  Questions have been answered.  All parties agreeable.  Please see the history and physical in the medical record for more information.  DESCRIPTION OF PROCEDURE: After the risks benefits and alternatives of the procedure were thoroughly explained, informed consent was obtained.  The    endoscope was introduced through the mouth and advanced to the second portion of the duodenum , limited by Without limitations. The instrument was slowly withdrawn as the mucosa was fully examined. Estimated blood loss is zero unless otherwise noted in this procedure report.    Normal-appearing tubular esophagus; stomach empty .  greater curvature antral erosions.  No ulcer or infiltrating process. Small hiatal hernia.  Patent pylorus but friable mucosa. Normal-appearing first and second portion of the duodenum.  Scope was withdrawn, a 54 [place] Maloney dilator was passed to full insertion easily.  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  There was no apparent complication and minimal bleeding with this maneuver. Subsequently, biopsies the abnormal gastric mucosa taken for histologic study.  EBL 5 mL.  Retroflexed views revealed as previously described. The scope was then withdrawn from the patient and the  procedure completed.  COMPLICATIONS: There were no immediate complications.  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 Hiatal hernia. Gastric erosions of uncertain significance?"status post gastric biopsy.  RECOMMENDATIONS: Follow-up on pathology. See colonoscopy report.  REPEAT EXAM:  eSigned:  R. Roetta Sessions, MD Jerrel Ivory [facility] 02-15-2015 2:57 PM    CC:  CPT CODES: ICD CODES:  The ICD and CPT codes recommended by this software are interpretations from the data that the clinical staff has captured with the software.  The verification of the translation of this report to the ICD and CPT codes and modifiers is the sole responsibility of the health care institution and practicing physician where this report was generated.  [employer] will not be held responsible for the validity of the ICD and CPT codes included on this report.  AMA assumes no liability for data contained or not contained herein. CPT is a Publishing rights manager of the [employer].  PATIENT NAME:  [name], [name] MR#: [id number]

## 2015-01-16 NOTE — Interval H&P Note (Signed)
History and Physical Interval Note:  01/16/2015 1:20 PM  Karen Mueller  has presented today for surgery, with the diagnosis of rectal bleeding/N/n\V  The various methods of treatment have been discussed with the patient and family. After consideration of risks, benefits and other options for treatment, the patient has consented to  Procedure(s) with comments: COLONOSCOPY WITH PROPOFOL (N/A) - 100 ESOPHAGOGASTRODUODENOSCOPY (EGD) WITH PROPOFOL (N/A) MALONEY DILATION (N/A) as a surgical intervention .  The patient's history has been reviewed, patient examined, no change in status, stable for surgery.  I have reviewed the patient's chart and labs.  Questions were answered to the patient's satisfaction.     Karen Mueller  No change.The risks, benefits, limitations, imponderables and alternatives regarding both EGD and colonoscopy have been reviewed with the patient. Questions have been answered. All parties agreeable.

## 2015-01-16 NOTE — Interval H&P Note (Signed)
History and Physical Interval Note:  01/16/2015 1:18 PM  Karen Mueller  has presented today for surgery, with the diagnosis of rectal bleeding/N/n\V  The various methods of treatment have been discussed with the patient and family. After consideration of risks, benefits and other options for treatment, the patient has consented to  Procedure(s) with comments: COLONOSCOPY WITH PROPOFOL (N/A) - 100 ESOPHAGOGASTRODUODENOSCOPY (EGD) WITH PROPOFOL (N/A) MALONEY DILATION (N/A) as a surgical intervention .  The patient's history has been reviewed, patient examined, no change in status, stable for surgery.  I have reviewed the patient's chart and labs.  Questions were answered to the patient's satisfaction.     Karen Mueller  No change. EGD with possible esophageal dilation and colonoscopy (rectal bleeding and chronic diarrhea) per plan.  The risks, benefits, limitations, imponderables and alternatives regarding both EGD and colonoscopy have been reviewed with the patient. Questions have been answered. All parties agreeable.

## 2015-01-17 ENCOUNTER — Encounter (HOSPITAL_COMMUNITY): Payer: Self-pay | Admitting: Internal Medicine

## 2015-01-17 DIAGNOSIS — K449 Diaphragmatic hernia without obstruction or gangrene: Secondary | ICD-10-CM | POA: Diagnosis not present

## 2015-01-17 DIAGNOSIS — K921 Melena: Secondary | ICD-10-CM | POA: Diagnosis not present

## 2015-01-17 DIAGNOSIS — K3189 Other diseases of stomach and duodenum: Secondary | ICD-10-CM | POA: Diagnosis not present

## 2015-01-17 DIAGNOSIS — K648 Other hemorrhoids: Secondary | ICD-10-CM | POA: Diagnosis not present

## 2015-01-17 NOTE — Addendum Note (Signed)
Addendum  created 01/17/15 0848 by Earleen Newport, CRNA   Modules edited: Charges VN

## 2015-01-24 ENCOUNTER — Encounter: Payer: Self-pay | Admitting: Internal Medicine

## 2015-01-27 ENCOUNTER — Telehealth: Payer: Self-pay

## 2015-01-27 NOTE — Telephone Encounter (Signed)
Per RMR- Send letter to patient.  Send copy of letter with path to referring provider and PCP.  Can we check w pcp about holding metformin. If so, ov 6 weeks after holding to see where we stand

## 2015-01-28 NOTE — Telephone Encounter (Signed)
Pt called office and she is aware of results and recommendations per RMR.

## 2015-01-28 NOTE — Telephone Encounter (Signed)
Letter has been faxed to Dr.Desai in [place].

## 2015-01-31 ENCOUNTER — Ambulatory Visit: Payer: Medicare Other | Admitting: Gastroenterology

## 2015-02-26 LAB — HEMOGLOBIN A1C: Hgb A1c MFr Bld: 8.2 % — AB (ref 4.0–6.0)

## 2015-03-04 ENCOUNTER — Other Ambulatory Visit: Payer: Self-pay | Admitting: Gastroenterology

## 2015-03-11 ENCOUNTER — Telehealth: Payer: Self-pay | Admitting: Gastroenterology

## 2015-03-11 ENCOUNTER — Encounter: Payer: Self-pay | Admitting: Gastroenterology

## 2015-03-11 ENCOUNTER — Ambulatory Visit: Payer: Medicare Other | Admitting: Gastroenterology

## 2015-03-11 NOTE — Telephone Encounter (Signed)
PATIENT WAS A NO SHOW AND LETTER SENT  °

## 2015-04-02 ENCOUNTER — Ambulatory Visit: Payer: Self-pay | Admitting: "Endocrinology

## 2015-04-04 ENCOUNTER — Ambulatory Visit: Payer: Medicare Other | Admitting: Gastroenterology

## 2015-04-14 ENCOUNTER — Encounter: Payer: Self-pay | Admitting: Nutrition

## 2015-04-14 ENCOUNTER — Ambulatory Visit (INDEPENDENT_AMBULATORY_CARE_PROVIDER_SITE_OTHER): Payer: Medicare Other | Admitting: "Endocrinology

## 2015-04-14 ENCOUNTER — Encounter: Payer: Medicare Other | Attending: "Endocrinology | Admitting: Nutrition

## 2015-04-14 ENCOUNTER — Encounter: Payer: Self-pay | Admitting: "Endocrinology

## 2015-04-14 VITALS — BP 124/80 | HR 81 | Ht 65.0 in | Wt 230.0 lb

## 2015-04-14 VITALS — Ht 65.0 in | Wt 230.0 lb

## 2015-04-14 DIAGNOSIS — E782 Mixed hyperlipidemia: Secondary | ICD-10-CM | POA: Insufficient documentation

## 2015-04-14 DIAGNOSIS — E1159 Type 2 diabetes mellitus with other circulatory complications: Secondary | ICD-10-CM | POA: Insufficient documentation

## 2015-04-14 DIAGNOSIS — E785 Hyperlipidemia, unspecified: Secondary | ICD-10-CM

## 2015-04-14 DIAGNOSIS — IMO0002 Reserved for concepts with insufficient information to code with codable children: Secondary | ICD-10-CM

## 2015-04-14 DIAGNOSIS — Z6838 Body mass index (BMI) 38.0-38.9, adult: Secondary | ICD-10-CM | POA: Insufficient documentation

## 2015-04-14 DIAGNOSIS — E6609 Other obesity due to excess calories: Secondary | ICD-10-CM

## 2015-04-14 DIAGNOSIS — E1165 Type 2 diabetes mellitus with hyperglycemia: Secondary | ICD-10-CM | POA: Diagnosis present

## 2015-04-14 DIAGNOSIS — Z713 Dietary counseling and surveillance: Secondary | ICD-10-CM | POA: Insufficient documentation

## 2015-04-14 DIAGNOSIS — E118 Type 2 diabetes mellitus with unspecified complications: Secondary | ICD-10-CM

## 2015-04-14 DIAGNOSIS — Z794 Long term (current) use of insulin: Secondary | ICD-10-CM | POA: Insufficient documentation

## 2015-04-14 DIAGNOSIS — I10 Essential (primary) hypertension: Secondary | ICD-10-CM | POA: Diagnosis not present

## 2015-04-14 NOTE — Progress Notes (Signed)
Subjective:    Patient ID: Karen Mueller, female    DOB: May 23, 1946,    Past Medical History  Diagnosis Date  . Diabetes (HCC)   . Hypercholesterolemia   . Hypertension   . GERD (gastroesophageal reflux disease)   . Sleep apnea   . Asthma   . Heart disease   . HIV (human immunodeficiency virus infection) (HCC)   . Hypothyroidism   . Schizophrenia (HCC)   . Depression with anxiety   . Chronic back pain   . Spinal stenosis   . Arthritis    Past Surgical History  Procedure Laterality Date  . Coronary angioplasty with stent placement      X 2  . Tubal ligation    . Hernia repair      umbilical  . Colonoscopy with propofol N/A 01/16/2015    RMR: internal hemorrhoids/left-side diverticulosis  . Esophagogastroduodenoscopy (egd) with propofol N/A 01/16/2015    [id number] cervical esophageal with s/p dilation/HH  . Maloney dilation N/A 01/16/2015    Procedure: [name] DILATION;  Surgeon: Corbin Ade, MD;  Location: [facility];  Service: Endoscopy;  Laterality: N/A;  54/  . Esophageal biopsy N/A 01/16/2015    Procedure: BIOPSY;  Surgeon: Corbin Ade, MD;  Location: [facility];  Service: Endoscopy;  Laterality: N/A;  Gastric, Ascending, Descending/Sigmoid   Social History   Social History  . Marital Status: Single    Spouse Name: N/A  . Number of Children: N/A  . Years of Education: N/A   Social History Main Topics  . Smoking status: Never Smoker   . Smokeless tobacco: None  . Alcohol Use: No  . Drug Use: No  . Sexual Activity: No   Other Topics Concern  . None   Social History Narrative   Outpatient Encounter Prescriptions as of 04/14/2015  Medication Sig  . insulin glargine (LANTUS) 100 UNIT/ML injection Inject 30 Units into the skin at bedtime.  . sitaGLIPtin (JANUVIA) 50 MG tablet Take 50 mg by mouth daily.  Marland Kitchen albuterol (PROVENTIL HFA;VENTOLIN HFA) 108 (90 BASE) MCG/ACT inhaler Inhale 2 puffs into the lungs every 4 (four) hours as needed for wheezing or shortness  of breath.  Marland Kitchen albuterol (PROVENTIL) (2.5 MG/3ML) 0.083% nebulizer solution Take 2.5 mg by nebulization every 6 (six) hours as needed for wheezing or shortness of breath.  Marland Kitchen aspirin 81 MG tablet Take 81 mg by mouth daily.  . benztropine (COGENTIN) 1 MG tablet 1 mg  . bisacodyl (DULCOLAX) 5 MG EC tablet Take two dulcolax tablets at 12:00 pm on 01/15/15 and then two more after you finish drinking your prep on 01/15/15.  . cyanocobalamin (,VITAMIN B-12,) 1000 MCG/ML injection Inject 1,000 mcg into the muscle once.  Marland Kitchen dexlansoprazole (DEXILANT) 60 MG capsule Take by mouth.  . dicyclomine (BENTYL) 10 MG capsule TAKE 1 CAPSULE(10 MG) BY MOUTH FOUR TIMES DAILY BEFORE MEALS AND AT BEDTIME  . emtricitabine-tenofovir (TRUVADA) 200-300 MG per tablet Take 1 tablet by mouth daily.  . ergocalciferol (VITAMIN D2) 50000 UNITS capsule Take 50,000 Units by mouth once a week.  . gabapentin (NEURONTIN) 600 MG tablet Take 600 mg by mouth at bedtime.  . hydrOXYzine (VISTARIL) 50 MG capsule 50 mg  . ibuprofen (ADVIL,MOTRIN) 600 MG tablet Take 600 mg by mouth every 6 (six) hours as needed.  . loperamide (IMODIUM) 2 MG capsule Take 2 mg by mouth as needed for diarrhea or loose stools.  . metoprolol tartrate (LOPRESSOR) 25 MG tablet Take by mouth.  Marland Kitchen  mirabegron ER (MYRBETRIQ) 50 MG TB24 tablet Take 50 mg by mouth daily.  . montelukast (SINGULAIR) 10 MG tablet Take 10 mg by mouth at bedtime.  . Multiple Vitamins-Minerals (CERTA PLUS) TABS Take 1 tablet by mouth daily.  . ondansetron (ZOFRAN) 4 MG tablet Take 1 tablet (4 mg total) by mouth 3 (three) times daily before meals. (Patient not taking: Reported on 01/08/2015)  . polyethylene glycol-electrolytes (TRILYTE) 420 G solution Take 4,000 mLs by mouth as directed.  . potassium chloride (K-DUR,KLOR-CON) 10 MEQ tablet Take 10 mEq by mouth 2 (two) times daily.  . pravastatin (PRAVACHOL) 40 MG tablet Take 40 mg by mouth daily.  . QUEtiapine (SEROQUEL) 300 MG tablet Take 600 mg  by mouth at bedtime.  . ramelteon (ROZEREM) 8 MG tablet Take by mouth.  . sertraline (ZOLOFT) 25 MG tablet   . sodium phosphate (FLEET) 7-19 GM/118ML ENEM Place 133 mLs (1 enema total) rectally once.  . SUSTIVA 600 MG tablet   . tiZANidine (ZANAFLEX) 4 MG tablet Take 4 mg by mouth daily.   . valsartan (DIOVAN) 320 MG tablet Take 320 mg by mouth daily.  . [DISCONTINUED] metFORMIN (GLUCOPHAGE) 500 MG tablet    No facility-administered encounter medications on file as of 04/14/2015.   ALLERGIES: Allergies  Allergen Reactions  . Metronidazole Other (See Comments)    And itching  . Sulfa Antibiotics Other (See Comments)    And itching  . Benadryl [Diphenhydramine Hcl] Hives  . Other Other (See Comments)    Other Reaction: Allergy; cauliflower   VACCINATION STATUS:  There is no immunization history on file for this patient.  Diabetes Pertinent negatives for hypoglycemia include no confusion, headaches, pallor or seizures. Associated symptoms include fatigue. Pertinent negatives for diabetes include no chest pain, no polydipsia, no polyphagia and no polyuria.  Hyperlipidemia This is a chronic problem. The current episode started more than 1 year ago. Exacerbating diseases include diabetes and obesity. Pertinent negatives include no chest pain, myalgias or shortness of breath. Risk factors for coronary artery disease include dyslipidemia, diabetes mellitus and hypertension.  Hypertension This is a chronic problem. The current episode started more than 1 year ago. Pertinent negatives include no chest pain, headaches, palpitations or shortness of breath. Risk factors for coronary artery disease include dyslipidemia, diabetes mellitus and sedentary lifestyle. Past treatments include angiotensin blockers. Hypertensive end-organ damage includes CAD/MI.     Review of Systems  Constitutional: Positive for fatigue. Negative for unexpected weight change.  HENT: Negative for trouble swallowing and  voice change.   Eyes: Negative for visual disturbance.  Respiratory: Negative for cough, shortness of breath and wheezing.   Cardiovascular: Negative for chest pain, palpitations and leg swelling.  Gastrointestinal: Negative for nausea, vomiting and diarrhea.  Endocrine: Negative for cold intolerance, heat intolerance, polydipsia, polyphagia and polyuria.  Musculoskeletal: Negative for myalgias and arthralgias.  Skin: Negative for color change, pallor, rash and wound.  Neurological: Negative for seizures and headaches.  Psychiatric/Behavioral: Negative for suicidal ideas and confusion.    Objective:    BP 124/80 mmHg  Pulse 81  Ht 5\' 5"  (1.651 m)  Wt 230 lb (104.327 kg)  BMI 38.27 kg/m2  SpO2 97%  Wt Readings from Last 3 Encounters:  04/14/15 230 lb (104.327 kg)  01/13/15 228 lb (103.42 kg)  12/31/14 227 lb 6.4 oz (103.148 kg)    Physical Exam  Constitutional: She is oriented to person, place, and time. She appears well-developed.  HENT:  Head: Normocephalic and atraumatic.  Eyes:  EOM are normal.  Neck: Normal range of motion. Neck supple. No tracheal deviation present. No thyromegaly present.  Cardiovascular: Normal rate and regular rhythm.   Pulmonary/Chest: Effort normal and breath sounds normal.  Abdominal: Soft. Bowel sounds are normal. There is no tenderness. There is no guarding.  Musculoskeletal: She exhibits no edema.  She uses her walker to get around.  Neurological: She is alert and oriented to person, place, and time. She has normal reflexes. No cranial nerve deficit. Coordination normal.  She uses her walker to get around.  Skin: Skin is warm and dry. No rash noted. No erythema. No pallor.  Psychiatric: She has a normal mood and affect. Judgment normal.    Results for orders placed or performed in visit on 04/14/15  Hemoglobin A1c  Result Value Ref Range   Hgb A1c MFr Bld 8.2 (A) 4.0 - 6.0 %   Complete Blood Count (Most recent): Lab Results  Component  Value Date   WBC 4.5 01/13/2015   HGB 11.8* 01/13/2015   HCT 35.7* 01/13/2015   MCV 86.7 01/13/2015   PLT 305 01/13/2015   Chemistry (most recent): Lab Results  Component Value Date   NA 136 01/13/2015   K 4.7 01/13/2015   CL 103 01/13/2015   CO2 22 01/13/2015   BUN 20 01/13/2015   CREATININE 1.20* 01/13/2015   Diabetic Labs (most recent): Lab Results  Component Value Date   HGBA1C 8.2* 02/26/2015   Lipid profile (most recent): No results found for: TRIG, CHOL       Assessment & Plan:   1. Type 2 diabetes mellitus with vascular disease (HCC)   Her diabetes is  complicated by coronary artery disease. Patient came with persistently elevated glucose profile, and  recent A1c of 8.2 %.  Patient is struggling to follow monitoring and insulin administration instructions. She monitored randomly, average blood glucose still above 250. She has significant social problem, she forgets to eat on time and to monitor on time. Ideally, she needs assistance with her diabetes by a skilled personal either at home or at group home. However she is against placement at this point. -.  Recent labs reviewed. - Patient remains at a high risk for more acute and chronic complications of diabetes which include CAD, CVA, CKD, retinopathy, and neuropathy. These are all discussed in detail with the patient.  - I have re-counseled the patient on diet management and weight loss  by adopting a carbohydrate restricted / protein rich  Diet. - Patient is advised to stick to a routine mealtimes to eat 3 meals  a day and avoid unnecessary snacks ( to snack only to correct hypoglycemia).  - Suggestion is made for patient to avoid simple carbohydrates   from their diet including Cakes , Desserts, Ice Cream,  Soda (  diet and regular) , Sweet Tea , Candies,  Chips, Cookies, Artificial Sweeteners,   and "Sugar-free" Products .  This will help patient to have stable blood glucose profile and potentially avoid  unintended  Weight gain.  - The patient  will be  scheduled with Norm Salt, RDN, CDE for individualized DM education. - I have approached patient with the following individualized plan to manage diabetes and patient agrees.  -Readjust her basal insulin Lantus to 30 units  , we will switch to morning because she gets the help from her 8 in the morning time. -She is advised to monitor blood glucose twice a day before breakfast and before based. - Patient is  warned not to take insulin without proper monitoring per orders.  -Patient is encouraged to call clinic for blood glucose levels less than 70 or above 300 mg /dl. - I will continue Januvia 100 mg by mouth daily, therapeutically suitable for patient. -She does not tolerate metformin.  - Patient specific target  for A1c; LDL, HDL, Triglycerides, and  Waist Circumference were discussed in detail.  2) BP/HTN:  uncontrolled. Continue current medications including ARB.  3) Lipids/HPL: continue statins. 4)  Weight/Diet: CDE consult in progress, exercise, and carbohydrates information provided.  5) Chronic Care/Health Maintenance:  -Patient is ARB and Statin medications and encouraged to continue to follow up with Ophthalmology, Podiatrist at least yearly or according to recommendations, and advised to  stay away from smoking. I have recommended yearly flu vaccine and pneumonia vaccination at least every 5 years; moderate intensity exercise for up to 150 minutes weekly; and  sleep for at least 7 hours a day.  I advised patient to maintain close follow up with their PCP for primary care needs.  Patient is asked to bring meter and  blood glucose logs during their next visit.   Follow up plan: Return in about 3 months (around 07/15/2015) for diabetes, high blood pressure, high cholesterol, follow up with pre-visit labs, meter, and logs.  Marquis Lunch, MD Phone: [phone]  Fax: [phone]   04/14/2015, 3:31 PM

## 2015-04-14 NOTE — Patient Instructions (Signed)
Advice for weight management -For most of us the best way to lose weight is by diet management. Generally speaking, diet management means restricting carbohydrate consumption to minimum possible (and to unprocessed or minimally processed complex starch) and increasing protein intake (animal or plant source), fruits, and vegetables.  -Sticking to a routine mealtime to eat 3 meals a day and avoiding unnecessary snacks is shown to have a big role in weight control.  -It is better to avoid simple carbohydrates including: Cakes, Desserts, Ice Cream, Soda (diet and regular), Sweet Tea, Candies, Chips, Cookies, Artificial Sweeteners, and "Sugar-free" Products.   -Exercise: 30 minutes a day 3-4 days a week, or 150 minutes a week. Combine stretch, strength, and aerobic activities. You may seek evaluation by your heart doctor prior to initiating exercise if you have high risk for heart disease.  -If you are interested, we can schedule a visit with Karen Mueller, RDN, CDE for individualized nutrition education.  

## 2015-04-14 NOTE — Progress Notes (Signed)
Medical Nutrition Therapy:  Appt start time: 1130 end time:  1200.   Assessment:  Primary concerns today: Diabetes Type 2 DM. A1C 8.2%.  She lives by herself by has [facility] that helps with meals and medications a few hours a week. She also has help from [facility] that provides some transportation and mental health services. [name] forgets to take her Lantus 28 units at night. I talked with Haskell RilingYolanda Ceasar, RN at [facility]  And Royann ShiversVivian Gregory from [facility] about the need for assistance of fixing meals for [name], and making sure she is compliant with taking her medications on time and eating meals on time for better blood sugar control. Dr. Fransico HimNida requests increased her Lantus to 30 units a day now and ordered her to take it in the AM before breakfast after she tests her blood sugars for greater compliance with it's administration. She often forgets to take her Lantus at night and there is not the support staff at night to assist with it. [facility] is drawing up her insulin and putting her meds in bubble packs for great compliance with medications. Support staff of [facility] and [facility] have agreed to assist with making sure she has taken her insulin in am daily and test bloods sugars first thing in am. [facility] agreed to assist with making sure [name] has breakfast and lunch prepared daily and left overs for supper available. She walks with a front wheeled walker with seat. Diet, meals and medication administration has been inconsistent. Goal is to get more help from support services so she can stay in her own home and improve her blood sugar control with supervision on meals and medications.   Lab Results  Component Value Date   HGBA1C 8.2* 02/26/2015    No results found for: CHOL, HDL, LDLCALC, LDLDIRECT, TRIG, CHOLHDL  Wt Readings from Last 3 Encounters:  04/14/15 230 lb (104.327 kg)  04/14/15 230 lb (104.327 kg)  01/13/15 228 lb (103.42 kg)   Ht Readings  from Last 3 Encounters:  04/14/15 5\' 5"  (1.651 m)  04/14/15 5\' 5"  (1.651 m)  01/13/15 5\' 5"  (1.651 m)   Body mass index is 38.27 kg/(m^2).  Preferred Learning Style:   No preference indicated   Learning Readiness:   Ready  Change in progress  MEDICATIONS: See List   DIETARY INTAKE:  Meals are inconsistent with what she eats and times of meals. Usual physical activity: ADL  Estimated energy needs: 1200 calories 135 g carbohydrates 90 g protein 33 g fat  Progress Towards Goal(s):  In progress.   Nutritional Diagnosis:  NB-1.1 Food and nutrition-related knowledge deficit As related to Diabetes.  As evidenced by A1C 8.2%..   Intervention: Nutrition and Diabetes education provided on My Plate, CHO counting, meal planning, portion sizes, timing of meals, avoiding snacks between meals unless having a low blood sugar, target ranges for A1C and blood sugars, signs/symptoms and treatment of hyper/hypoglycemia, monitoring blood sugars, taking medications as prescribed, benefits of exercising 30 minutes per day and prevention of complications of DM.  Goals:  1. Follow My Plate Method for meals. 2. Eat three meals per day B)6-8 am L) 12-2 pm D) 5-7 pm. 3. No snacks between meals unless having a low blood sugar. 4. Take 30 units of Lantus in abdomen area in the MORNING Daily instead of at night.. 5. Drink more water and less gatorade. 6. Cut out sodas, juice, sweets, cakes, cookies, pies  and Ramen Noodles. 7. Get A1C down to 7.5% in three months. 8. Test blood sugar in the am before eating breakfast and before taking Lantus.  Teaching Method Utilized: Visual Auditory Hands on  Handouts given during visit include:  The Plate Method  Meal Plan Card  Diabetes Instructions.  Barriers to learning/adherence to lifestyle change: Age, inability to remember to take meds on time. Unable to prepare own meals.  Demonstrated degree of understanding via:  Teach Back    Monitoring/Evaluation:  Dietary intake, exercise, meal planning, SBG, and body weight in 1 month(s).

## 2015-04-14 NOTE — Patient Instructions (Signed)
Goals:  1. Follow My Plate Method for meals. 2. Eat three meals per day B)6-8 am L) 12-2 pm D) 5-7 pm. 3. No snacks between meals unless having a low blood sugar. 4. Take 30 units of Lantus in abdomen area in the MORNING Daily instead of at night.. 5. Drink more water and less gatorade. 6. Cut out sodas, juice, sweets, cakes, cookies, pies and Ramen Noodles. 7. Get A1C down to 7.5% in three months. 8. Test blood sugar in the am before eating breakfast and before taking Lantus.

## 2015-04-16 ENCOUNTER — Telehealth: Payer: Self-pay | Admitting: Nutrition

## 2015-04-16 ENCOUNTER — Telehealth: Payer: Self-pay | Admitting: "Endocrinology

## 2015-04-16 NOTE — Telephone Encounter (Signed)
she received 28 units of syringes instead of 30

## 2015-04-16 NOTE — Telephone Encounter (Signed)
TC from [employer], [facility] requesting varification for insulin instructions for Mrs. Karen BaneHughes. Pt. Is to 1) Test blood sugars before breakfas and before taking insulint and write BS reading on log sheet. 2) Take 30 units of prefilled Lantus in AM before breakfast. 3)Eat breakfast between 8-9:30 am. L) 12-2 pm and D) 5-7 pm. Skipper Clicheindy Daden verified understanding. PC

## 2015-04-17 NOTE — Telephone Encounter (Signed)
Left message for pt to call us back. 

## 2015-04-24 ENCOUNTER — Telehealth: Payer: Self-pay | Admitting: Nutrition

## 2015-04-24 ENCOUNTER — Other Ambulatory Visit: Payer: Self-pay

## 2015-04-24 MED ORDER — INSULIN GLARGINE 100 UNIT/ML SOLOSTAR PEN: 30.0000 [IU] | PEN_INJECTOR | Freq: Every day | SUBCUTANEOUS | Status: DC

## 2015-04-24 NOTE — Telephone Encounter (Signed)
TC to Jefferey Picaindy Zaden, CNA with [facility] regarding pt's order of Lantus of 28 units. [facility] didn't get the updated prescription of the increase of Lantus to 30 units/d. Sherilyn BankerKim French, LPN will resubmit the prescription for 30 units of [facility] daily [facility] [place] on [address] Ms. Alvera NovelZaden notes Mrs. Kizzie BaneHughes is giving herself her insulin when she gets there at 9 am every morning. BS are running 180-240's in the am. Pharmacy will exchange out pre-drawn Lantus insulin syringes for Mrs. Kizzie BaneHughes once they get the new prescription of 30 units of Lantus daily. Ms. Jefferey PicaCindy Zaden verbalized understanding.

## 2015-04-28 ENCOUNTER — Encounter: Payer: Self-pay | Admitting: Gastroenterology

## 2015-04-28 ENCOUNTER — Ambulatory Visit (INDEPENDENT_AMBULATORY_CARE_PROVIDER_SITE_OTHER): Payer: Medicare Other | Admitting: Gastroenterology

## 2015-04-28 VITALS — BP 110/70 | HR 72 | Temp 97.1°F | Ht 65.0 in | Wt 231.0 lb

## 2015-04-28 DIAGNOSIS — R198 Other specified symptoms and signs involving the digestive system and abdomen: Secondary | ICD-10-CM | POA: Diagnosis not present

## 2015-04-28 DIAGNOSIS — K219 Gastro-esophageal reflux disease without esophagitis: Secondary | ICD-10-CM

## 2015-04-28 MED ORDER — ALIGN 4 MG PO CAPS: 4.0000 mg | ORAL_CAPSULE | Freq: Every day | ORAL | Status: DC

## 2015-04-28 NOTE — Progress Notes (Signed)
Primary Care Physician: Aggie CosierBALAJI DESAI, MD  Primary Gastroenterologist:  Roetta SessionsMichael Rourk, MD   Chief Complaint  Patient presents with  . Follow-up    having alot of diarrhea and then constipation    HPI: Karen Mueller is a 69 y.o. female here for follow-up. She has a history of IBS, intermittent vomiting, GERD, dysphagia. Seen in the office in July 2016. EGD in July 2016 Dr. Jena Gaussourk. 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 No H. pylori. Colonoscopy same day showed internal hemorrhoids, left-sided diverticulosis, random colon biopsies were negative for microscopic colitis.  Previously did not tolerate Viberzi due to nausea and vomiting. Bentyl didn't work. She has since stopped her metformin under the direction of Dr. Fransico HimNida as it was felt that this may be contributing  to her diarrhea.  Patient states the diarrhea stopped when she quit metformin. Now she's been having some issues with constipation, passing hard stools. She takes Bentyl for abdominal cramps with good relief. Recently someone started her on Linzess, she took one dose and had bad diarrhea for 2 days and decided not to take it again. She reports that her swallowing improved status post dilation. Denies any heartburn symptoms. No blood in the stool or melena. Abdominal pain, nonspecific. Patient difficult historian.     Current Outpatient Prescriptions  Medication Sig Dispense Refill  . albuterol (PROVENTIL HFA;VENTOLIN HFA) 108 (90 BASE) MCG/ACT inhaler Inhale 2 puffs into the lungs every 4 (four) hours as needed for wheezing or shortness of breath.    Marland Kitchen. albuterol (PROVENTIL) (2.5 MG/3ML) 0.083% nebulizer solution Take 2.5 mg by nebulization every 6 (six) hours as needed for wheezing or shortness of breath.    Marland Kitchen. amLODipine (NORVASC) 10 MG tablet Take 10 mg by mouth daily.    Marland Kitchen. aspirin 81 MG tablet Take 81 mg by mouth daily.    . benztropine (COGENTIN) 1 MG  tablet 1 mg    . clopidogrel (PLAVIX) 75 MG tablet Take 75 mg by mouth daily.    . cyanocobalamin (,VITAMIN B-12,) 1000 MCG/ML injection Inject 1,000 mcg into the muscle once.    Marland Kitchen. dexlansoprazole (DEXILANT) 60 MG capsule Take by mouth.    . dicyclomine (BENTYL) 10 MG capsule TAKE 1 CAPSULE(10 MG) BY MOUTH FOUR TIMES DAILY BEFORE MEALS AND AT BEDTIME 120 capsule 3  . ergocalciferol (VITAMIN D2) 50000 UNITS capsule Take 50,000 Units by mouth once a week.    . gabapentin (NEURONTIN) 600 MG tablet Take 600 mg by mouth at bedtime.    . hydrOXYzine (VISTARIL) 50 MG capsule 3 (three) times daily before meals. 50 mg    . Insulin Glargine (LANTUS SOLOSTAR) 100 UNIT/ML Solostar Pen Inject 30 Units into the skin at bedtime. 5 pen 3  . levothyroxine (SYNTHROID, LEVOTHROID) 75 MCG tablet Take 75 mcg by mouth daily before breakfast.    . Linaclotide (LINZESS) 145 MCG CAPS capsule Take 145 mcg by mouth daily.    Marland Kitchen. loperamide (IMODIUM) 2 MG capsule Take 2 mg by mouth as needed for diarrhea or loose stools.    . metoprolol tartrate (LOPRESSOR) 25 MG tablet Take by mouth 2 (two) times daily.     . mirabegron ER (MYRBETRIQ) 50 MG TB24 tablet Take 50 mg by mouth daily.    . montelukast (SINGULAIR) 10 MG tablet Take 10 mg by mouth at bedtime.    . Multiple Vitamins-Minerals (CERTA PLUS) TABS Take 1 tablet by mouth daily.    .Marland Kitchen  potassium chloride (K-DUR,KLOR-CON) 10 MEQ tablet Take 10 mEq by mouth 2 (two) times daily.    . pravastatin (PRAVACHOL) 40 MG tablet Take 40 mg by mouth daily.    . sertraline (ZOLOFT) 25 MG tablet 50 mg daily.     . sitaGLIPtin (JANUVIA) 50 MG tablet Take 100 mg by mouth daily.     . SUSTIVA 600 MG tablet     . tiZANidine (ZANAFLEX) 4 MG tablet Take 4 mg by mouth daily.     . valsartan (DIOVAN) 320 MG tablet Take 320 mg by mouth daily.    Marland Kitchen emtricitabine-tenofovir (TRUVADA) 200-300 MG per tablet Take 1 tablet by mouth daily.    . fluPHENAZine decanoate (PROLIXIN) 25 MG/ML injection     .  ondansetron (ZOFRAN) 4 MG tablet Take 1 tablet (4 mg total) by mouth 3 (three) times daily before meals. (Patient not taking: Reported on 01/08/2015) 90 tablet 3  . Probiotic Product (ALIGN) 4 MG CAPS Take 1 capsule (4 mg total) by mouth daily. 30 capsule 1  . QUEtiapine (SEROQUEL) 300 MG tablet Take 600 mg by mouth at bedtime.    . ramelteon (ROZEREM) 8 MG tablet Take by mouth.     No current facility-administered medications for this visit.    Allergies as of 04/28/2015 - Review Complete 04/28/2015  Allergen Reaction Noted  . Metronidazole Other (See Comments) 11/01/2014  . Sulfa antibiotics Other (See Comments) 01/01/2015  . Benadryl [diphenhydramine hcl] Hives 01/13/2015  . Other Other (See Comments) 11/01/2014   Past Surgical History  Procedure Laterality Date  . Coronary angioplasty with stent placement      X 2  . Tubal ligation    . Hernia repair      umbilical  . Colonoscopy with propofol N/A 01/16/2015    RMR: internal hemorrhoids/left-side diverticulosis  . Esophagogastroduodenoscopy (egd) with propofol N/A 01/16/2015    [id number] cervical esophageal with s/p dilation/HH.   [name] dilation N/A 01/16/2015    Procedure: [name] DILATION;  Surgeon: Corbin Ade, MD;  Location: [facility];  Service: Endoscopy;  Laterality: N/A;  54/  . Esophageal biopsy N/A 01/16/2015    Procedure: BIOPSY;  Surgeon: Corbin Ade, MD;  Location: [facility];  Service: Endoscopy;  Laterality: N/A;  Gastric, Ascending, Descending/Sigmoid   Past Medical History  Diagnosis Date  . Diabetes (HCC)   . Hypercholesterolemia   . Hypertension   . GERD (gastroesophageal reflux disease)   . Sleep apnea   . Asthma   . Heart disease   . HIV (human immunodeficiency virus infection) (HCC)   . Hypothyroidism   . Schizophrenia (HCC)   . Depression with anxiety   . Chronic back pain   . Spinal stenosis   . Arthritis      ROS:  General: Negative for anorexia, weight loss, fever, chills, fatigue,  weakness. ENT: Negative for hoarseness, difficulty swallowing , nasal congestion. CV: Negative for chest pain, angina, palpitations, dyspnea on exertion, peripheral edema.  Respiratory: Negative for dyspnea at rest, dyspnea on exertion, cough, sputum, wheezing.  GI: See history of present illness. GU:  Negative for dysuria, hematuria, urinary incontinence, urinary frequency, nocturnal urination.  Endo: Negative for unusual weight change.    Physical Examination:   BP 110/70 mmHg  Pulse 72  Temp(Src) 97.1 F (36.2 C) (Oral)  Ht  (1.651 m)  Wt 231 lb (104.781 kg)  BMI 38.44 kg/m2  General: Well-nourished, well-developed in no acute distress.  Eyes: No icterus. Mouth: Oropharyngeal  mucosa moist and pink , no lesions erythema or exudate. Lungs: Clear to auscultation bilaterally.  Heart: Regular rate and rhythm, no murmurs rubs or gallops.  Abdomen: Bowel sounds are normal, nontender, nondistended, no hepatosplenomegaly or masses, no abdominal bruits or hernia , no rebound or guarding.   Extremities: No lower extremity edema. No clubbing or deformities. Neuro: Alert and oriented x 4   Skin: Warm and dry, no jaundice.   Psych: Alert and cooperative, normal mood and affect.  Labs:  Lab Results  Component Value Date   CREATININE 1.20* 01/13/2015   BUN 20 01/13/2015   NA 136 01/13/2015   K 4.7 01/13/2015   CL 103 01/13/2015   CO2 22 01/13/2015   No results found for: ALT, AST, GGT, ALKPHOS, BILITOT Lab Results  Component Value Date   HGBA1C 8.2* 02/26/2015    Imaging Studies: No results found.

## 2015-04-28 NOTE — Patient Instructions (Signed)
1. Stop Linzess, this will cause diarrhea. 2. Use loperamide only as needed for diarrhea.  3. Start Align 4mg  daily for two months to regulate your bowels.  4. We will retrieve copy of your prior xrays for review and let you know if you need a CT for your abdominal pain.  5. Return to the office in 3 months.

## 2015-05-05 DIAGNOSIS — R198 Other specified symptoms and signs involving the digestive system and abdomen: Secondary | ICD-10-CM | POA: Insufficient documentation

## 2015-05-05 DIAGNOSIS — K219 Gastro-esophageal reflux disease without esophagitis: Secondary | ICD-10-CM | POA: Insufficient documentation

## 2015-05-05 NOTE — Assessment & Plan Note (Signed)
Well controlled. Dysphagia resolved s/p esophageal dilation.

## 2015-05-05 NOTE — Assessment & Plan Note (Signed)
Complains of alternating constipation/diarrhea. Diarrhea initally resolved after stopping metformin and then developed constipaiton. Linzess X 1 and had diarrhea for 2 days and refuses to take. More constipation predominant currently. TCS up to date.   Take bentyl and imodium sparingly and only as needed. Add align one daily. Retrieve record of recent imaging for review. Further recommendations to follow.

## 2015-05-06 NOTE — Progress Notes (Addendum)
CT abdomen pelvis with contrast dated 10/08/2014, [facility]  Tiny tubular structure projects from the posterior cecum which may reflect a normal appendix, bony structures demonstrate degenerative changes with grade 1 anteriorlisthesis L4-L5. There is irregular disc protrusion extending along the posterior L4 vertebral body.  No significant GI findings.   Continue outline as per plan the day of OV.  Would also recommend adding Miralax 17 grams daily in the evenings on days she does not have a BM. Keep upcoming appt in 06/2015. Call sooner if needed.

## 2015-05-06 NOTE — Progress Notes (Signed)
CC'D TO PCP °

## 2015-05-12 NOTE — Progress Notes (Signed)
Tried to call pt- NA 

## 2015-05-15 ENCOUNTER — Ambulatory Visit: Payer: Medicare Other | Admitting: Nutrition

## 2015-05-16 NOTE — Progress Notes (Signed)
Tried to call pt- NA 

## 2015-05-21 NOTE — Progress Notes (Signed)
Letter mailed to the pt. 

## 2015-05-30 ENCOUNTER — Encounter: Payer: Medicare Other | Attending: "Endocrinology | Admitting: Nutrition

## 2015-05-30 ENCOUNTER — Encounter: Payer: Self-pay | Admitting: Nutrition

## 2015-05-30 VITALS — Ht 65.0 in | Wt 238.4 lb

## 2015-05-30 DIAGNOSIS — E785 Hyperlipidemia, unspecified: Secondary | ICD-10-CM | POA: Insufficient documentation

## 2015-05-30 DIAGNOSIS — Z6838 Body mass index (BMI) 38.0-38.9, adult: Secondary | ICD-10-CM | POA: Diagnosis not present

## 2015-05-30 DIAGNOSIS — Z794 Long term (current) use of insulin: Secondary | ICD-10-CM | POA: Diagnosis not present

## 2015-05-30 DIAGNOSIS — Z713 Dietary counseling and surveillance: Secondary | ICD-10-CM | POA: Diagnosis not present

## 2015-05-30 DIAGNOSIS — E1165 Type 2 diabetes mellitus with hyperglycemia: Secondary | ICD-10-CM | POA: Diagnosis not present

## 2015-05-30 DIAGNOSIS — I10 Essential (primary) hypertension: Secondary | ICD-10-CM

## 2015-05-30 DIAGNOSIS — E1159 Type 2 diabetes mellitus with other circulatory complications: Secondary | ICD-10-CM

## 2015-05-30 NOTE — Progress Notes (Signed)
  Medical Nutrition Therapy:  Appt start time: 1130 end time:  1200.   Assessment:  Primary concerns today: Diabetes Type 2 DM.  Last A1C 8.2%. BS still running in the 200's. Gained 8 lbs from last visit. Walks with walker.. Limited mobility. She is now taking her Ltantus insulin in the morning- 30 units  And still on Janvuvia.. BS log brought in but missing quite a few readings. Diet is high in fat and sodium based on what she ate for breakfast. Admits to eating other foods and drinking soda some during the day.  She has a big bag of chips in her bag and says she isn't eating them. Diet non compliance.  Goal is to get more help from support services so she can stay in her own home and improve her blood sugar control with supervision on meals and medications.   Lab Results  Component Value Date   HGBA1C 8.2* 02/26/2015    No results found for: CHOL, HDL, LDLCALC, LDLDIRECT, TRIG, CHOLHDL  Wt Readings from Last 3 Encounters:  05/30/15 238 lb 6.4 oz (108.138 kg)  04/28/15 231 lb (104.781 kg)  04/14/15 230 lb (104.327 kg)   Ht Readings from Last 3 Encounters:  05/30/15 5\' 5"  (1.651 m)  04/28/15 5\' 5"  (1.651 m)  04/14/15 5\' 5"  (1.651 m)   Body mass index is 39.67 kg/(m^2).  Preferred Learning Style:   No preference indicated   Learning Readiness:   Ready  Change in progress  MEDICATIONS: See List   DIETARY INTAKE:  B) Sausge, egg and cheese biscuits. L)  D) Estimated energy needs: 1200 calories 135 g carbohydrates 90 g protein 33 g fat  Progress Towards Goal(s):  In progress.   Nutritional Diagnosis:  NB-1.1 Food and nutrition-related knowledge deficit As related to Diabetes.  As evidenced by A1C 8.2%..   Intervention: Nutrition and Diabetes education provided on My Plate, CHO counting, meal planning, portion sizes, timing of meals, avoiding snacks between meals unless having a low blood sugar, target ranges for A1C and blood sugars, signs/symptoms and treatment of  hyper/hypoglycemia, monitoring blood sugars, taking medications as prescribed, benefits of exercising 30 minutes per day and prevention of complications of DM.  Goals:  1. Follow My Plate Method for meals. 2. Cut out sausage, egg and cheese biscuits and eat cereal of bran flakes instead. 3. Increase fresh fruits and vegetable. 4. Drink more water 5. Cut out sodas, juice, sweets, cakes, cookies, pies and Ramen Noodles 6. No snacks between meals unless having a low blood sugar.  Take 30 units of Lantus in abdomen area in the MORNING Daily instead of at night.. 7. Get A1C down to 7.5% in three months. 8. Test blood sugar in the am before eating breakfast and before taking Lantus.  Teaching Method Utilized: Visual Auditory Hands on  Handouts given during visit include:  The Plate Method  Meal Plan Card  Diabetes Instructions.  Barriers to learning/adherence to lifestyle change: Age, inability to remember to take meds on time. Unable to prepare own meals.  Demonstrated degree of understanding via:  Teach Back   Monitoring/Evaluation:  Dietary intake, exercise, meal planning, SBG, and body weight in 3 month(s).

## 2015-05-30 NOTE — Patient Instructions (Signed)
Goals:  1. Follow My Plate Method for meals. 2. Cut out sausage, egg and cheese biscuits and eat cereal of bran flakes instead. 3. Increase fresh fruits and vegetable. 4. Drink more water 5. Cut out sodas, juice, sweets, cakes, cookies, pies and Ramen Noodles 6. No snacks between meals unless having a low blood sugar.  Take 30 units of Lantus in abdomen area in the MORNING Daily instead of at night.. 7. Get A1C down to 7.5% in three months. 8. Test blood sugar in the am before eating breakfast and before taking Lantus.

## 2015-07-01 ENCOUNTER — Other Ambulatory Visit: Payer: Self-pay | Admitting: Nurse Practitioner

## 2015-07-04 LAB — HEMOGLOBIN A1C: Hemoglobin A1C: 7.9

## 2015-07-11 ENCOUNTER — Encounter: Payer: Self-pay | Admitting: "Endocrinology

## 2015-07-18 ENCOUNTER — Encounter: Payer: Self-pay | Admitting: "Endocrinology

## 2015-07-18 ENCOUNTER — Ambulatory Visit (INDEPENDENT_AMBULATORY_CARE_PROVIDER_SITE_OTHER): Payer: Medicare Other | Admitting: "Endocrinology

## 2015-07-18 VITALS — BP 126/76 | HR 80 | Ht 65.0 in | Wt 240.0 lb

## 2015-07-18 DIAGNOSIS — I10 Essential (primary) hypertension: Secondary | ICD-10-CM

## 2015-07-18 DIAGNOSIS — E1159 Type 2 diabetes mellitus with other circulatory complications: Secondary | ICD-10-CM | POA: Diagnosis not present

## 2015-07-18 DIAGNOSIS — E785 Hyperlipidemia, unspecified: Secondary | ICD-10-CM | POA: Diagnosis not present

## 2015-07-18 MED ORDER — INSULIN GLARGINE 100 UNIT/ML SOLOSTAR PEN: 20.0000 [IU] | PEN_INJECTOR | Freq: Every morning | SUBCUTANEOUS | Status: DC

## 2015-07-18 NOTE — Patient Instructions (Signed)

## 2015-07-18 NOTE — Progress Notes (Signed)
Subjective:    Patient ID: Karen Mueller, female    DOB: [birth year],    Past Medical History  Diagnosis Date  . Diabetes (HCC)   . Hypercholesterolemia   . Hypertension   . GERD (gastroesophageal reflux disease)   . Sleep apnea   . Asthma   . Heart disease   . HIV (human immunodeficiency virus infection) (HCC)   . Hypothyroidism   . Schizophrenia (HCC)   . Depression with anxiety   . Chronic back pain   . Spinal stenosis   . Arthritis    Past Surgical History  Procedure Laterality Date  . Coronary angioplasty with stent placement      X 2  . Tubal ligation    . Hernia repair      umbilical  . Colonoscopy with propofol N/A 01/16/2015    RMR: internal hemorrhoids/left-side diverticulosis, segmental biopsies negative  . Esophagogastroduodenoscopy (egd) with propofol N/A 01/16/2015    [id number] cervical esophageal with s/p dilation/HH. gastric erosions, benign and no h.pylori  . Maloney dilation N/A 01/16/2015    Procedure: [name] DILATION;  Surgeon: Corbin Ade, MD;  Location: [facility];  Service: Endoscopy;  Laterality: N/A;  54/  . Esophageal biopsy N/A 01/16/2015    Procedure: BIOPSY;  Surgeon: Corbin Ade, MD;  Location: [facility];  Service: Endoscopy;  Laterality: N/A;  Gastric, Ascending, Descending/Sigmoid   Social History   Social History  . Marital Status: Single    Spouse Name: N/A  . Number of Children: N/A  . Years of Education: N/A   Social History Main Topics  . Smoking status: Never Smoker   . Smokeless tobacco: None     Comment: Never smoked  . Alcohol Use: No  . Drug Use: No  . Sexual Activity: No   Other Topics Concern  . None   Social History Narrative   Outpatient Encounter Prescriptions as of 07/18/2015  Medication Sig  . Insulin Glargine (LANTUS SOLOSTAR) 100 UNIT/ML Solostar Pen Inject 20 Units into the skin every morning.  . sitaGLIPtin (JANUVIA) 50 MG tablet Take 100 mg by mouth daily.   . [DISCONTINUED] Insulin Glargine (LANTUS  SOLOSTAR) 100 UNIT/ML Solostar Pen Inject 30 Units into the skin at bedtime.  Marland Kitchen albuterol (PROVENTIL HFA;VENTOLIN HFA) 108 (90 BASE) MCG/ACT inhaler Inhale 2 puffs into the lungs every 4 (four) hours as needed for wheezing or shortness of breath.  Marland Kitchen albuterol (PROVENTIL) (2.5 MG/3ML) 0.083% nebulizer solution Take 2.5 mg by nebulization every 6 (six) hours as needed for wheezing or shortness of breath.  Marland Kitchen amLODipine (NORVASC) 10 MG tablet Take 10 mg by mouth daily.  Marland Kitchen aspirin 81 MG tablet Take 81 mg by mouth daily.  . benztropine (COGENTIN) 1 MG tablet 1 mg  . clopidogrel (PLAVIX) 75 MG tablet Take 75 mg by mouth daily.  . cyanocobalamin (,VITAMIN B-12,) 1000 MCG/ML injection Inject 1,000 mcg into the muscle once.  Marland Kitchen dexlansoprazole (DEXILANT) 60 MG capsule Take by mouth.  . dicyclomine (BENTYL) 10 MG capsule TAKE 1 CAPSULE(10 MG) BY MOUTH FOUR TIMES DAILY BEFORE MEALS AND AT BEDTIME  . emtricitabine-tenofovir (TRUVADA) 200-300 MG per tablet Take 1 tablet by mouth daily.  . ergocalciferol (VITAMIN D2) 50000 UNITS capsule Take 50,000 Units by mouth once a week.  . fluPHENAZine decanoate (PROLIXIN) 25 MG/ML injection   . gabapentin (NEURONTIN) 600 MG tablet Take 600 mg by mouth at bedtime.  . hydrOXYzine (VISTARIL) 50 MG capsule 3 (three) times daily before meals. 50  mg  . levothyroxine (SYNTHROID, LEVOTHROID) 75 MCG tablet Take 75 mcg by mouth daily before breakfast.  . Linaclotide (LINZESS) 145 MCG CAPS capsule Take 145 mcg by mouth daily.  Marland Kitchen loperamide (IMODIUM) 2 MG capsule Take 2 mg by mouth as needed for diarrhea or loose stools.  . metoprolol tartrate (LOPRESSOR) 25 MG tablet Take by mouth 2 (two) times daily.   . mirabegron ER (MYRBETRIQ) 50 MG TB24 tablet Take 50 mg by mouth daily.  . montelukast (SINGULAIR) 10 MG tablet Take 10 mg by mouth at bedtime.  . Multiple Vitamins-Minerals (CERTA PLUS) TABS Take 1 tablet by mouth daily.  . ondansetron (ZOFRAN) 4 MG tablet Take 1 tablet (4 mg  total) by mouth 3 (three) times daily before meals. (Patient not taking: Reported on 01/08/2015)  . potassium chloride (K-DUR,KLOR-CON) 10 MEQ tablet Take 10 mEq by mouth 2 (two) times daily.  . pravastatin (PRAVACHOL) 40 MG tablet Take 40 mg by mouth daily.  . Probiotic Product (ALIGN) 4 MG CAPS Take 1 capsule (4 mg total) by mouth daily.  . QUEtiapine (SEROQUEL) 300 MG tablet Take 600 mg by mouth at bedtime.  . ramelteon (ROZEREM) 8 MG tablet Take by mouth.  . sertraline (ZOLOFT) 25 MG tablet 50 mg daily.   . SUSTIVA 600 MG tablet   . tiZANidine (ZANAFLEX) 4 MG tablet Take 4 mg by mouth daily.   . valsartan (DIOVAN) 320 MG tablet Take 320 mg by mouth daily.   No facility-administered encounter medications on file as of 07/18/2015.   ALLERGIES: Allergies  Allergen Reactions  . Metronidazole Other (See Comments)    And itching  . Sulfa Antibiotics Other (See Comments)    And itching  . Benadryl [Diphenhydramine Hcl] Hives  . Other Other (See Comments)    Other Reaction: Allergy; cauliflower   VACCINATION STATUS:  There is no immunization history on file for this patient.  Diabetes She presents for her follow-up diabetic visit. She has type 2 diabetes mellitus. Her disease course has been improving. Pertinent negatives for hypoglycemia include no confusion, headaches, pallor or seizures. Associated symptoms include fatigue. Pertinent negatives for diabetes include no chest pain, no polydipsia, no polyphagia and no polyuria. Symptoms are improving. Risk factors for coronary artery disease include diabetes mellitus, dyslipidemia, hypertension, obesity and sedentary lifestyle. Current diabetic treatment includes insulin injections and oral agent (monotherapy). She is compliant with treatment some of the time. She is following a generally unhealthy diet. When asked about meal planning, she reported none. She has had a previous visit with a dietitian. She never participates in exercise. (She is  not monitoring on a regular basis, only has 3 readings in the last week showing 252, 111, 271.) An ACE inhibitor/angiotensin II receptor blocker is being taken.  Hyperlipidemia This is a chronic problem. The current episode started more than 1 year ago. Exacerbating diseases include diabetes and obesity. Pertinent negatives include no chest pain, myalgias or shortness of breath. Risk factors for coronary artery disease include dyslipidemia, diabetes mellitus and hypertension.  Hypertension This is a chronic problem. The current episode started more than 1 year ago. Pertinent negatives include no chest pain, headaches, palpitations or shortness of breath. Risk factors for coronary artery disease include dyslipidemia, diabetes mellitus and sedentary lifestyle. Past treatments include angiotensin blockers. Hypertensive end-organ damage includes CAD/MI.     Review of Systems  Constitutional: Positive for fatigue. Negative for unexpected weight change.  HENT: Negative for trouble swallowing and voice change.   Eyes: Negative  for visual disturbance.  Respiratory: Negative for cough, shortness of breath and wheezing.   Cardiovascular: Negative for chest pain, palpitations and leg swelling.  Gastrointestinal: Negative for nausea, vomiting and diarrhea.  Endocrine: Negative for cold intolerance, heat intolerance, polydipsia, polyphagia and polyuria.  Musculoskeletal: Negative for myalgias and arthralgias.  Skin: Negative for color change, pallor, rash and wound.  Neurological: Negative for seizures and headaches.  Psychiatric/Behavioral: Negative for suicidal ideas and confusion.    Objective:    BP 126/76 mmHg  Pulse 80  Ht  (1.651 m)  Wt 240 lb (108.863 kg)  BMI 39.94 kg/m2  SpO2 96%  Wt Readings from Last 3 Encounters:  07/18/15 240 lb (108.863 kg)  05/30/15 238 lb 6.4 oz (108.138 kg)  04/28/15 231 lb (104.781 kg)    Physical Exam  Constitutional: She is oriented to person, place,  and time. She appears well-developed.  HENT:  Head: Normocephalic and atraumatic.  Eyes: EOM are normal.  Neck: Normal range of motion. Neck supple. No tracheal deviation present. No thyromegaly present.  Cardiovascular: Normal rate and regular rhythm.   Pulmonary/Chest: Effort normal and breath sounds normal.  Abdominal: Soft. Bowel sounds are normal. There is no tenderness. There is no guarding.  Musculoskeletal: She exhibits no edema.  She uses her walker to get around.  Neurological: She is alert and oriented to person, place, and time. She has normal reflexes. No cranial nerve deficit. Coordination normal.  She uses her walker to get around.  Skin: Skin is warm and dry. No rash noted. No erythema. No pallor.  Psychiatric: She has a normal mood and affect. Judgment normal.    Results for orders placed or performed in visit on 04/14/15  Hemoglobin A1c  Result Value Ref Range   Hgb A1c MFr Bld 8.2 (A) 4.0 - 6.0 %   Complete Blood Count (Most recent): Lab Results  Component Value Date   WBC 4.5 01/13/2015   HGB 11.8* 01/13/2015   HCT 35.7* 01/13/2015   MCV 86.7 01/13/2015   PLT 305 01/13/2015   Chemistry (most recent): Lab Results  Component Value Date   NA 136 01/13/2015   K 4.7 01/13/2015   CL 103 01/13/2015   CO2 22 01/13/2015   BUN 20 01/13/2015   CREATININE 1.20* 01/13/2015   Diabetic Labs (most recent): Lab Results  Component Value Date   HGBA1C 8.2* 02/26/2015   Assessment & Plan:   1. Type 2 diabetes mellitus with vascular disease (HCC)   Her diabetes is  complicated by coronary artery disease. Patient came with persistently elevated glucose profile, and  recent A1c 7.9% improving from 8.2 %.  Patient is struggling to follow monitoring and insulin administration instructions. She monitored   only rarely and randomly. She has significant social problem, she forgets to eat on time and to monitor on time. Ideally, she needs assistance with her diabetes by  a skilled personal either at home or at group home. However she is against placement at this point. -.  Recent labs reviewed. - Patient remains at a high risk for more acute and chronic complications of diabetes which include CAD, CVA, CKD, retinopathy, and neuropathy. These are all discussed in detail with the patient.  - I have re-counseled the patient on diet management and weight loss  by adopting a carbohydrate restricted / protein rich  Diet. - Patient is advised to stick to a routine mealtimes to eat 3 meals  a day and avoid unnecessary snacks ( to snack  only to correct hypoglycemia).  - Suggestion is made for patient to avoid simple carbohydrates   from their diet including Cakes , Desserts, Ice Cream,  Soda (  diet and regular) , Sweet Tea , Candies,  Chips, Cookies, Artificial Sweeteners,   and "Sugar-free" Products .  This will help patient to have stable blood glucose profile and potentially avoid unintended  Weight gain.  - The patient  will be  scheduled with Norm Salt, RDN, CDE for individualized DM education. - I have approached patient with the following individualized plan to manage diabetes and patient agrees.  - For patient safety, I will lower  her Lantus to 20 units,  we will switch to morning because she gets the help from her aid in the morning time. -She is advised to monitor blood glucose twice a day before breakfast and before based. - Patient is warned not to take insulin without proper monitoring per orders.  -Patient is encouraged to call clinic for blood glucose levels less than 70 or above 300 mg /dl. - I will continue Januvia 50 mg by mouth daily, therapeutically suitable for patient. -She does not tolerate metformin.  - Patient specific target  for A1c; LDL, HDL, Triglycerides, and  Waist Circumference were discussed in detail.  2) BP/HTN:  uncontrolled. Continue current medications including ARB.  3) Lipids/HPL: continue statins. 4)  Weight/Diet: CDE  consult in progress, exercise, and carbohydrates information provided.  5) Chronic Care/Health Maintenance:  -Patient is ARB and Statin medications and encouraged to continue to follow up with Ophthalmology, Podiatrist at least yearly or according to recommendations, and advised to  stay away from smoking. I have recommended yearly flu vaccine and pneumonia vaccination at least every 5 years;  and  sleep for at least 7 hours a day.  I advised patient to maintain close follow up with their PCP for primary care needs.  Patient is asked to bring meter and  blood glucose logs during their next visit.   Follow up plan: Return in about 3 months (around 10/16/2015) for diabetes, high blood pressure, high cholesterol, follow up with pre-visit labs, meter, and logs.  Marquis Lunch, MD Phone: [phone]  Fax: [phone]   07/18/2015, 2:30 PM

## 2015-07-25 ENCOUNTER — Encounter: Payer: Self-pay | Admitting: "Endocrinology

## 2015-07-28 ENCOUNTER — Encounter: Payer: Self-pay | Admitting: Internal Medicine

## 2015-07-28 ENCOUNTER — Ambulatory Visit (INDEPENDENT_AMBULATORY_CARE_PROVIDER_SITE_OTHER): Payer: Medicare Other | Admitting: Gastroenterology

## 2015-07-28 ENCOUNTER — Encounter: Payer: Self-pay | Admitting: Gastroenterology

## 2015-07-28 ENCOUNTER — Other Ambulatory Visit: Payer: Self-pay | Admitting: Gastroenterology

## 2015-07-28 VITALS — BP 128/82 | HR 88 | Ht 65.0 in | Wt 241.6 lb

## 2015-07-28 DIAGNOSIS — R198 Other specified symptoms and signs involving the digestive system and abdomen: Secondary | ICD-10-CM

## 2015-07-28 DIAGNOSIS — K219 Gastro-esophageal reflux disease without esophagitis: Secondary | ICD-10-CM | POA: Diagnosis not present

## 2015-07-28 DIAGNOSIS — Z1159 Encounter for screening for other viral diseases: Secondary | ICD-10-CM

## 2015-07-28 MED ORDER — LUBIPROSTONE 24 MCG PO CAPS: 24.0000 ug | ORAL_CAPSULE | Freq: Two times a day (BID) | ORAL | Status: DC

## 2015-07-28 NOTE — Assessment & Plan Note (Addendum)
Doing well. Continue Dexilant.  Check Hep C ab per patient request.

## 2015-07-28 NOTE — Progress Notes (Signed)
Primary Care Physician: Aggie Cosier, MD  Primary Gastroenterologist:  Roetta Sessions, MD   Chief Complaint  Patient presents with  . Follow-up    HPI: Karen Mueller is a 70 y.o. female here for f/u.   She has a history of IBS, intermittent vomiting, GERD, dysphagia. Seen in the office in July 2016. EGD in July 2016 Dr. Jena Gauss. 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 No H. pylori. Colonoscopy same day showed internal hemorrhoids, left-sided diverticulosis, random colon biopsies were negative for microscopic colitis.  Previously didn't tolerate Viberzi due to N/V. Bentyl helps with cramps. Stopping metformin corrected diarrhea. Now more constipation. Linzess caused bad diarrhea. She has her pills placed in pill cards by the pharmacy. It is not clear if she tried miralax as recommended. She is difficult historian.  She states she is fine except for constipation. Incomplete BMs. Wants to stay on bentyl because it helps with cramping. Currently on  QID. Heartburn controlled. No abdominal pain, melena, brbpr. No dysphagia.   She wants to be checked for Hep C. I ensured her that she has likely been checked before because she has HIV and that he something that she would've been checked for previously. She still wants to be checked again.  Current Outpatient Prescriptions  Medication Sig Dispense Refill  . albuterol (PROVENTIL HFA;VENTOLIN HFA) 108 (90 BASE) MCG/ACT inhaler Inhale 2 puffs into the lungs every 4 (four) hours as needed for wheezing or shortness of breath.    Marland Kitchen albuterol (PROVENTIL) (2.5 MG/3ML) 0.083% nebulizer solution Take 2.5 mg by nebulization every 6 (six) hours as needed for wheezing or shortness of breath.    Marland Kitchen amLODipine (NORVASC) 10 MG tablet Take 10 mg by mouth daily.    Marland Kitchen aspirin 81 MG tablet Take 81 mg by mouth daily.    . benztropine (COGENTIN) 1 MG tablet 1 mg    . clopidogrel (PLAVIX) 75  MG tablet Take 75 mg by mouth daily.    . cyanocobalamin (,VITAMIN B-12,) 1000 MCG/ML injection Inject 1,000 mcg into the muscle once.    Marland Kitchen dexlansoprazole (DEXILANT) 60 MG capsule Take by mouth.    . dicyclomine (BENTYL) 10 MG capsule TAKE 1 CAPSULE(10 MG) BY MOUTH FOUR TIMES DAILY BEFORE MEALS AND AT BEDTIME 120 capsule 5  . emtricitabine-tenofovir (TRUVADA) 200-300 MG per tablet Take 1 tablet by mouth daily.    . ergocalciferol (VITAMIN D2) 50000 UNITS capsule Take 50,000 Units by mouth once a week.    . gabapentin (NEURONTIN) 600 MG tablet Take 600 mg by mouth at bedtime.    . hydrOXYzine (VISTARIL) 50 MG capsule 3 (three) times daily before meals. 50 mg    . Insulin Glargine (LANTUS SOLOSTAR) 100 UNIT/ML Solostar Pen Inject 20 Units into the skin every morning. 5 pen 2  . levothyroxine (SYNTHROID, LEVOTHROID) 75 MCG tablet Take 75 mcg by mouth daily before breakfast.    . metoprolol tartrate (LOPRESSOR) 25 MG tablet Take by mouth 2 (two) times daily.     . mirabegron ER (MYRBETRIQ) 50 MG TB24 tablet Take 50 mg by mouth daily.    . montelukast (SINGULAIR) 10 MG tablet Take 10 mg by mouth at bedtime.    . Multiple Vitamins-Minerals (CERTA PLUS) TABS Take 1 tablet by mouth daily.    . potassium chloride (K-DUR,KLOR-CON) 10 MEQ tablet Take 10 mEq by mouth 2 (two) times daily.    . pravastatin (PRAVACHOL) 40 MG tablet Take  40 mg by mouth daily.    . Probiotic Product (ALIGN) 4 MG CAPS Take 1 capsule (4 mg total) by mouth daily. 30 capsule 1  . QUEtiapine (SEROQUEL) 300 MG tablet Take 600 mg by mouth at bedtime.    . ramelteon (ROZEREM) 8 MG tablet Take by mouth.    . sertraline (ZOLOFT) 25 MG tablet 50 mg daily.     . sitaGLIPtin (JANUVIA) 50 MG tablet Take 100 mg by mouth daily.     . SUSTIVA 600 MG tablet     . tiZANidine (ZANAFLEX) 4 MG tablet Take 4 mg by mouth daily.     . valsartan (DIOVAN) 320 MG tablet Take 320 mg by mouth daily.     No current facility-administered medications for  this visit.    Allergies as of 07/28/2015 - Review Complete 07/28/2015  Allergen Reaction Noted  . Metronidazole Other (See Comments) 11/01/2014  . Sulfa antibiotics Other (See Comments) 01/01/2015  . Benadryl [diphenhydramine hcl] Hives 01/13/2015  . Other Other (See Comments) 11/01/2014    ROS:  General: Negative for anorexia, weight loss, fever, chills, fatigue, weakness. ENT: Negative for hoarseness, difficulty swallowing , nasal congestion. CV: Negative for chest pain, angina, palpitations, dyspnea on exertion, peripheral edema.  Respiratory: Negative for dyspnea at rest, dyspnea on exertion, cough, sputum, wheezing.  GI: See history of present illness. GU:  Negative for dysuria, hematuria, urinary incontinence, urinary frequency, nocturnal urination.  Endo: Negative for unusual weight change.    Physical Examination:   BP 128/82 mmHg  Pulse 88  Ht  (1.651 m)  Wt 241 lb 9.6 oz (109.589 kg)  BMI 40.20 kg/m2  General: Well-nourished, well-developed in no acute distress.  Eyes: No icterus. Mouth: Oropharyngeal mucosa moist and pink , no lesions erythema or exudate. Lungs: Clear to auscultation bilaterally.  Heart: Regular rate and rhythm, no murmurs rubs or gallops.  Abdomen: Bowel sounds are normal, nontender, nondistended, no hepatosplenomegaly or masses, no abdominal bruits or hernia , no rebound or guarding.   Extremities: No lower extremity edema. No clubbing or deformities. Neuro: Alert and oriented x 4   Skin: Warm and dry, no jaundice.   Psych: Alert and cooperative, normal mood and affect.  Labs:  No results found for: GGT Lab Results  Component Value Date   HGBA1C 7.9 07/04/2015   Lab Results  Component Value Date   CREATININE 1.20* 01/13/2015   BUN 20 01/13/2015   NA 136 01/13/2015   K 4.7 01/13/2015   CL 103 01/13/2015   CO2 22 01/13/2015    Imaging Studies: No results found.

## 2015-07-28 NOTE — Assessment & Plan Note (Signed)
Probably constipated at this point. Discussed with patient that Bentyl is likely contributing to this but she wants to continue because it helps with her abdominal cramping. Initially we will add Amitiza 24 g twice a day. If she continues to complain of constipation, we need to cut back on her Bentyl to at least twice daily. Return to the office in one year or call sooner if needed.

## 2015-07-28 NOTE — Patient Instructions (Signed)
1. Please have your labs done. 2. Try Amitiza once pill twice daily for constipation. Take with food.  3. If you continue to have constipation, next step would be to decrease your bentyl (blue capsule) for abdominal cramps. 4. Return in one year or sooner if needed.

## 2015-07-28 NOTE — Progress Notes (Signed)
CC'D TO PCP °

## 2015-07-29 ENCOUNTER — Telehealth: Payer: Self-pay

## 2015-07-29 LAB — HEPATITIS C ANTIBODY

## 2015-07-29 NOTE — Telephone Encounter (Signed)
Lab for Hep C antibody has been placed on [facility] desk.

## 2015-07-29 NOTE — Progress Notes (Signed)
Quick Note:  Please let patient know her HCV ab is NEGATIVE ______

## 2015-08-28 ENCOUNTER — Ambulatory Visit: Payer: Medicare Other | Admitting: Nutrition

## 2015-09-22 ENCOUNTER — Encounter: Payer: Medicare Other | Attending: "Endocrinology | Admitting: Nutrition

## 2015-09-22 VITALS — Ht 64.0 in | Wt 237.0 lb

## 2015-09-22 DIAGNOSIS — IMO0002 Reserved for concepts with insufficient information to code with codable children: Secondary | ICD-10-CM

## 2015-09-22 DIAGNOSIS — E118 Type 2 diabetes mellitus with unspecified complications: Secondary | ICD-10-CM

## 2015-09-22 DIAGNOSIS — E1165 Type 2 diabetes mellitus with hyperglycemia: Secondary | ICD-10-CM

## 2015-09-22 DIAGNOSIS — Z794 Long term (current) use of insulin: Secondary | ICD-10-CM

## 2015-09-22 DIAGNOSIS — E1159 Type 2 diabetes mellitus with other circulatory complications: Secondary | ICD-10-CM | POA: Insufficient documentation

## 2015-09-22 NOTE — Progress Notes (Signed)
  Medical Nutrition Therapy:  Appt start time: 1130 end time:  1200.   Assessment:  Primary concerns today: Diabetes Type 2 DM.  Currently taking 30 units of Lantus daily in am and on Januvia.  BS log brought in: BS ranging 177-300 mg/cl. Needs to cut out cheetos and snacks. She notes she is staying really thirsty. Lost 4 lbs, most likely from hyperglycemia. Has an aide that helps her in the home. LImited mobility. Walks with walker. Diet remains excessive in carbs and contributing to elevated blood sugars. Diet non compliance.    Lab Results  Component Value Date   HGBA1C 7.9 07/04/2015    No results found for: CHOL, HDL, LDLCALC, LDLDIRECT, TRIG, CHOLHDL  Wt Readings from Last 3 Encounters:  07/28/15 241 lb 9.6 oz (109.589 kg)  07/18/15 240 lb (108.863 kg)  05/30/15 238 lb 6.4 oz (108.138 kg)   Ht Readings from Last 3 Encounters:  07/28/15 5\' 5"  (1.651 m)  07/18/15 5\' 5"  (1.651 m)  05/30/15 5\' 5"  (1.651 m)   There is no weight on file to calculate BMI.  Preferred Learning Style:   No preference indicated   Learning Readiness:   Ready  Change in progress  MEDICATIONS: See List   DIETARY INTAKE:  B) Eggs and bologna, L) CHicken, potatoes, green beans,  Water, Diet Soda D) French fries, apples, water  Estimated energy needs: 1200 calories 135 g carbohydrates 90 g protein 33 g fat  Progress Towards Goal(s):  In progress.   Nutritional Diagnosis:  NB-1.1 Food and nutrition-related knowledge deficit As related to Diabetes.  As evidenced by A1C 8.2%..   Intervention: Nutrition and Diabetes education provided on My Plate, CHO counting, meal planning, portion sizes, timing of meals, avoiding snacks between meals unless having a low blood sugar, target ranges for A1C and blood sugars, signs/symptoms and treatment of hyper/hypoglycemia, monitoring blood sugars, taking medications as prescribed, benefits of exercising 30 minutes per day and prevention of complications  of DM.  Goals:  Need to be compliant with your diet.  : 1. Follow Plate Method 2. Drink more water-5 bottles of water 3. NO chips, cookies, diet sodas 4. Cut out snacks between meals 5. Take 30 units of Lantus daily.    Teaching Method Utilized: Visual Auditory Hands on  Handouts given during visit include:  The Plate Method  Meal Plan Card  Diabetes Instructions.  Barriers to learning/adherence to lifestyle change: Age, inability to remember to take meds on time. Unable to prepare own meals.  Demonstrated degree of understanding via:  Teach Back   Monitoring/Evaluation:  Dietary intake, exercise, meal planning, SBG, and body weight in 1- 3 month(s).

## 2015-09-22 NOTE — Patient Instructions (Signed)
Goals: 1. Follow Plate Method 2. Drink more water-5 bottles of water 3. NO chips, cookies, diet sodas 4. Cut out snacks between meals 5. Take 30 units of Lantus daily.

## 2015-10-15 LAB — HEMOGLOBIN A1C: HEMOGLOBIN A1C: 7.8

## 2015-10-22 ENCOUNTER — Encounter: Payer: Medicare Other | Attending: "Endocrinology | Admitting: Nutrition

## 2015-10-22 ENCOUNTER — Encounter: Payer: Self-pay | Admitting: "Endocrinology

## 2015-10-22 ENCOUNTER — Ambulatory Visit (INDEPENDENT_AMBULATORY_CARE_PROVIDER_SITE_OTHER): Payer: Medicare Other | Admitting: "Endocrinology

## 2015-10-22 VITALS — Ht 64.0 in | Wt 239.0 lb

## 2015-10-22 VITALS — BP 120/77 | HR 87 | Ht 64.0 in | Wt 239.0 lb

## 2015-10-22 DIAGNOSIS — Z794 Long term (current) use of insulin: Secondary | ICD-10-CM

## 2015-10-22 DIAGNOSIS — E6609 Other obesity due to excess calories: Secondary | ICD-10-CM

## 2015-10-22 DIAGNOSIS — E039 Hypothyroidism, unspecified: Secondary | ICD-10-CM | POA: Diagnosis not present

## 2015-10-22 DIAGNOSIS — E118 Type 2 diabetes mellitus with unspecified complications: Secondary | ICD-10-CM

## 2015-10-22 DIAGNOSIS — I10 Essential (primary) hypertension: Secondary | ICD-10-CM

## 2015-10-22 DIAGNOSIS — IMO0002 Reserved for concepts with insufficient information to code with codable children: Secondary | ICD-10-CM

## 2015-10-22 DIAGNOSIS — E1159 Type 2 diabetes mellitus with other circulatory complications: Secondary | ICD-10-CM

## 2015-10-22 DIAGNOSIS — E785 Hyperlipidemia, unspecified: Secondary | ICD-10-CM | POA: Diagnosis not present

## 2015-10-22 DIAGNOSIS — E1165 Type 2 diabetes mellitus with hyperglycemia: Secondary | ICD-10-CM

## 2015-10-22 NOTE — Patient Instructions (Signed)

## 2015-10-22 NOTE — Progress Notes (Signed)
  Medical Nutrition Therapy:  Appt start time: 1130 end time:  1200.   Assessment:  Primary concerns today: Diabetes Type 2 DM.  A1`C 7.9%.  Was on 30 units of Lantus and Januvia. Saw Dr. Fransico HimNida today and he stopped her Lantus due to better blood sugars and risk of hypoglycemia. She has a CNA that help her during the day with meals and shopping.. Still eating out at times. Gained 2 lbs. Cognitive abilities limit her for safety of meds and meals. She notes she is having neck surgery in June and may need to be in a nursing home for rehab after that.  She would benefit from an assisting living situation to help ensure healthy balanced meals and safety of getting and taking medications. Her non compliance may be due to physical and mental limitations.    Lab Results  Component Value Date   HGBA1C 7.9 07/04/2015    No results found for: CHOL, HDL, LDLCALC, LDLDIRECT, TRIG, CHOLHDL  Wt Readings from Last 3 Encounters:  10/22/15 239 lb (108.41 kg)  10/22/15 239 lb (108.41 kg)  09/22/15 237 lb (107.502 kg)   Ht Readings from Last 3 Encounters:  10/22/15 5\' 4"  (1.626 m)  10/22/15 5\' 4"  (1.626 m)  09/22/15 5\' 4"  (1.626 m)   Body mass index is 41 kg/(m^2).  Preferred Learning Style:   No preference indicated   Learning Readiness:   Ready  Change in progress  MEDICATIONS: See List   DIETARY INTAKE:  Diet is inconsistent with meals and times and quantities of food.   Estimated energy needs: 1200 calories 135 g carbohydrates 90 g protein 33 g fat  Progress Towards Goal(s):  In progress.   Nutritional Diagnosis:  NB-1.1 Food and nutrition-related knowledge deficit As related to Diabetes.  As evidenced by A1C 8.2%..   Intervention: Nutrition and Diabetes education provided on My Plate, CHO counting, meal planning, portion sizes, timing of meals, avoiding snacks between meals unless having a low blood sugar, target ranges for A1C and blood sugars, signs/symptoms and treatment of  hyper/hypoglycemia, monitoring blood sugars, taking medications as prescribed, benefits of exercising 30 minutes per day and prevention of complications of DM.  Goals:  Need to be compliant with your diet.  : 1. Follow Plate Method 2. Drink more water-5 bottles of water 3. NO chips, cookies, diet sodas 4. Cut out snacks between meals 5. Eat three meals per day. 6.  DO not skip meals. 6. Stop taking Lantus daily.   Teaching Method Utilized: Visual Auditory Hands on  Handouts given during visit include:  The Plate Method  Meal Plan Card  Diabetes Instructions.  Barriers to learning/adherence to lifestyle change: Age, inability to remember to take meds on time. Unable to prepare own meals.  Demonstrated degree of understanding via:  Teach Back   Monitoring/Evaluation:  Dietary intake, exercise, meal planning, SBG, and body weight in 3 month(s).

## 2015-10-22 NOTE — Patient Instructions (Addendum)
  1. Follow Plate Method 2. Drink more water-5 bottles of water 3. NO chips, cookies, diet sodas 4. Cut out snacks between meals 5. Eat three meals per day. 6.  DO not skip meals. 6. Stop taking Lantus daily per Dr. Fransico HimNida

## 2015-10-22 NOTE — Progress Notes (Signed)
Subjective:    Patient ID: Karen Mueller, female    DOB: [birth year],    Past Medical History  Diagnosis Date  . Diabetes (HCC)   . Hypercholesterolemia   . Hypertension   . GERD (gastroesophageal reflux disease)   . Sleep apnea   . Asthma   . Heart disease   . HIV (human immunodeficiency virus infection) (HCC)   . Hypothyroidism   . Schizophrenia (HCC)   . Depression with anxiety   . Chronic back pain   . Spinal stenosis   . Arthritis    Past Surgical History  Procedure Laterality Date  . Coronary angioplasty with stent placement      X 2  . Tubal ligation    . Hernia repair      umbilical  . Colonoscopy with propofol N/A 01/16/2015    RMR: internal hemorrhoids/left-side diverticulosis, segmental biopsies negative  . Esophagogastroduodenoscopy (egd) with propofol N/A 01/16/2015    [id number] cervical esophageal with s/p dilation/HH. gastric erosions, benign and no h.pylori  . Maloney dilation N/A 01/16/2015    Procedure: [name] DILATION;  Surgeon: Corbin Ade, MD;  Location: [facility];  Service: Endoscopy;  Laterality: N/A;  54/  . Biopsy N/A 01/16/2015    Procedure: BIOPSY;  Surgeon: Corbin Ade, MD;  Location: [facility];  Service: Endoscopy;  Laterality: N/A;  Gastric, Ascending, Descending/Sigmoid   Social History   Social History  . Marital Status: Single    Spouse Name: N/A  . Number of Children: N/A  . Years of Education: N/A   Social History Main Topics  . Smoking status: Never Smoker   . Smokeless tobacco: None     Comment: Never smoked  . Alcohol Use: No  . Drug Use: No  . Sexual Activity: No   Other Topics Concern  . None   Social History Narrative   Outpatient Encounter Prescriptions as of 10/22/2015  Medication Sig  . albuterol (PROVENTIL HFA;VENTOLIN HFA) 108 (90 BASE) MCG/ACT inhaler Inhale 2 puffs into the lungs every 4 (four) hours as needed for wheezing or shortness of breath.  Marland Kitchen albuterol (PROVENTIL) (2.5 MG/3ML) 0.083% nebulizer  solution Take 2.5 mg by nebulization every 6 (six) hours as needed for wheezing or shortness of breath.  Marland Kitchen amLODipine (NORVASC) 10 MG tablet Take 10 mg by mouth daily.  Marland Kitchen aspirin 81 MG tablet Take 81 mg by mouth daily.  . benztropine (COGENTIN) 1 MG tablet 1 mg  . clopidogrel (PLAVIX) 75 MG tablet Take 75 mg by mouth daily.  . cyanocobalamin (,VITAMIN B-12,) 1000 MCG/ML injection Inject 1,000 mcg into the muscle once.  Marland Kitchen dexlansoprazole (DEXILANT) 60 MG capsule Take by mouth.  . dicyclomine (BENTYL) 10 MG capsule TAKE 1 CAPSULE(10 MG) BY MOUTH FOUR TIMES DAILY BEFORE MEALS AND AT BEDTIME  . emtricitabine-tenofovir (TRUVADA) 200-300 MG per tablet Take 1 tablet by mouth daily.  . ergocalciferol (VITAMIN D2) 50000 UNITS capsule Take 50,000 Units by mouth once a week.  . gabapentin (NEURONTIN) 600 MG tablet Take 600 mg by mouth at bedtime.  . hydrOXYzine (VISTARIL) 50 MG capsule 3 (three) times daily before meals. 50 mg  . Insulin Glargine (LANTUS SOLOSTAR) 100 UNIT/ML Solostar Pen Inject 20 Units into the skin every morning.  Marland Kitchen levothyroxine (SYNTHROID, LEVOTHROID) 75 MCG tablet Take 75 mcg by mouth daily before breakfast.  . lubiprostone (AMITIZA) 24 MCG capsule Take 1 capsule (24 mcg total) by mouth 2 (two) times daily with a meal.  . metoprolol tartrate (  LOPRESSOR) 25 MG tablet Take by mouth 2 (two) times daily.   . mirabegron ER (MYRBETRIQ) 50 MG TB24 tablet Take 50 mg by mouth daily.  . montelukast (SINGULAIR) 10 MG tablet Take 10 mg by mouth at bedtime.  . Multiple Vitamins-Minerals (CERTA PLUS) TABS Take 1 tablet by mouth daily.  . potassium chloride (K-DUR,KLOR-CON) 10 MEQ tablet Take 10 mEq by mouth 2 (two) times daily.  . pravastatin (PRAVACHOL) 40 MG tablet Take 40 mg by mouth daily.  . Probiotic Product (ALIGN) 4 MG CAPS Take 1 capsule (4 mg total) by mouth daily.  . QUEtiapine (SEROQUEL) 300 MG tablet Take 600 mg by mouth at bedtime.  . ramelteon (ROZEREM) 8 MG tablet Take by mouth.   . sertraline (ZOLOFT) 25 MG tablet 50 mg daily.   . sitaGLIPtin (JANUVIA) 50 MG tablet Take 100 mg by mouth daily.   . SUSTIVA 600 MG tablet   . tiZANidine (ZANAFLEX) 4 MG tablet Take 4 mg by mouth daily.   . valsartan (DIOVAN) 320 MG tablet Take 320 mg by mouth daily.   No facility-administered encounter medications on file as of 10/22/2015.   ALLERGIES: Allergies  Allergen Reactions  . Metronidazole Other (See Comments)    And itching  . Sulfa Antibiotics Other (See Comments)    And itching  . Benadryl [Diphenhydramine Hcl] Hives  . Other Other (See Comments)    Other Reaction: Allergy; cauliflower   VACCINATION STATUS:  There is no immunization history on file for this patient.  Diabetes She presents for her follow-up diabetic visit. She has type 2 diabetes mellitus. Her disease course has been improving. Pertinent negatives for hypoglycemia include no confusion, headaches, pallor or seizures. Associated symptoms include fatigue. Pertinent negatives for diabetes include no chest pain, no polydipsia, no polyphagia and no polyuria. Symptoms are improving. Risk factors for coronary artery disease include diabetes mellitus, dyslipidemia, hypertension, obesity and sedentary lifestyle. Current diabetic treatment includes insulin injections and oral agent (monotherapy) (She is unreliable to use insulin. She reports that she injects insulin at random. She did not monitor blood glucose in the last 3 weeks at least. She did not bring her meter nor logs to review.). She is compliant with treatment some of the time. She is following a generally unhealthy diet. When asked about meal planning, she reported none. She has had a previous visit with a dietitian. She never participates in exercise. (  ) An ACE inhibitor/angiotensin II receptor blocker is being taken.  Hyperlipidemia This is a chronic problem. The current episode started more than 1 year ago. Exacerbating diseases include diabetes and  obesity. Pertinent negatives include no chest pain, myalgias or shortness of breath. Risk factors for coronary artery disease include dyslipidemia, diabetes mellitus and hypertension.  Hypertension This is a chronic problem. The current episode started more than 1 year ago. Pertinent negatives include no chest pain, headaches, palpitations or shortness of breath. Risk factors for coronary artery disease include dyslipidemia, diabetes mellitus and sedentary lifestyle. Past treatments include angiotensin blockers. Hypertensive end-organ damage includes CAD/MI.     Review of Systems  Constitutional: Positive for fatigue. Negative for unexpected weight change.  HENT: Negative for trouble swallowing and voice change.   Eyes: Negative for visual disturbance.  Respiratory: Negative for cough, shortness of breath and wheezing.   Cardiovascular: Negative for chest pain, palpitations and leg swelling.  Gastrointestinal: Negative for nausea, vomiting and diarrhea.  Endocrine: Negative for cold intolerance, heat intolerance, polydipsia, polyphagia and polyuria.  Musculoskeletal: Negative  for myalgias and arthralgias.  Skin: Negative for color change, pallor, rash and wound.  Neurological: Negative for seizures and headaches.  Psychiatric/Behavioral: Negative for suicidal ideas and confusion.    Objective:    BP 120/77 mmHg  Pulse 87  Ht 5\' 4"  (1.626 m)  Wt 239 lb (108.41 kg)  BMI 41.00 kg/m2  SpO2 98%  Wt Readings from Last 3 Encounters:  10/22/15 239 lb (108.41 kg)  10/22/15 239 lb (108.41 kg)  09/22/15 237 lb (107.502 kg)    Physical Exam  Constitutional: She is oriented to person, place, and time. She appears well-developed.  HENT:  Head: Normocephalic and atraumatic.  Eyes: EOM are normal.  Neck: Normal range of motion. Neck supple. No tracheal deviation present. No thyromegaly present.  Cardiovascular: Normal rate and regular rhythm.   Pulmonary/Chest: Effort normal and breath sounds  normal.  Abdominal: Soft. Bowel sounds are normal. There is no tenderness. There is no guarding.  Musculoskeletal: She exhibits no edema.  She uses her walker to get around.  Neurological: She is alert and oriented to person, place, and time. She has normal reflexes. No cranial nerve deficit. Coordination normal.  She uses her walker to get around.  Skin: Skin is warm and dry. No rash noted. No erythema. No pallor.  Psychiatric: She has a normal mood and affect. Judgment normal.    Results for orders placed or performed in visit on 10/22/15  Hemoglobin A1c  Result Value Ref Range   Hemoglobin A1C 7.8    Complete Blood Count (Most recent): Lab Results  Component Value Date   WBC 4.5 01/13/2015   HGB 11.8* 01/13/2015   HCT 35.7* 01/13/2015   MCV 86.7 01/13/2015   PLT 305 01/13/2015   Chemistry (most recent): Lab Results  Component Value Date   NA 136 01/13/2015   K 4.7 01/13/2015   CL 103 01/13/2015   CO2 22 01/13/2015   BUN 20 01/13/2015   CREATININE 1.20* 01/13/2015   Diabetic Labs (most recent): Lab Results  Component Value Date   HGBA1C 7.8 10/15/2015   HGBA1C 7.9 07/04/2015   HGBA1C 8.2* 02/26/2015   Assessment & Plan:   1. Type 2 diabetes mellitus with vascular disease (HCC)   Her diabetes is  complicated by coronary artery disease. Patient came with persistently elevated glucose profile, and  recent A1c 7.8% improving from 8.2 %.  Patient is struggling to follow monitoring and insulin administration instructions. She Did not monitor blood glucose in 3 weeks.  She has significant social problem, she forgets to eat on time and to monitor on time. Ideally, she needs assistance with her diabetes by a skilled personal either at home or at group home. However she is against placement at this point. -.  Recent labs reviewed. - Patient remains at a high risk for more acute and chronic complications of diabetes which include CAD, CVA, CKD, retinopathy, and  neuropathy. These are all discussed in detail with the patient.  - I have re-counseled the patient on diet management and weight loss  by adopting a carbohydrate restricted / protein rich  Diet. - Patient is advised to stick to a routine mealtimes to eat 3 meals  a day and avoid unnecessary snacks ( to snack only to correct hypoglycemia).  - Suggestion is made for patient to avoid simple carbohydrates   from their diet including Cakes , Desserts, Ice Cream,  Soda (  diet and regular) , Sweet Tea , Candies,  Chips, Cookies, Artificial Sweeteners,  and "Sugar-free" Products .  This will help patient to have stable blood glucose profile and potentially avoid unintended  Weight gain.  - The patient  will be  scheduled with Norm Salt, RDN, CDE for individualized DM education. - I have approached patient with the following individualized plan to manage diabetes and patient agrees.  - For patient safety, I will discontinue her Lantus for now. -Patient is encouraged to call clinic for blood glucose levels less than 70 or above 300 mg /dl. - I will continue Januvia 50 mg by mouth daily, therapeutically suitable for patient. -She does not tolerate metformin.  - Patient specific target  for A1c; LDL, HDL, Triglycerides, and  Waist Circumference were discussed in detail.  2) BP/HTN:  uncontrolled. Continue current medications including ARB.  3) Lipids/HPL: continue statins. 4)  Weight/Diet: CDE consult in progress, exercise, and carbohydrates information provided.  5) Chronic Care/Health Maintenance:  -Patient is ARB and Statin medications and encouraged to continue to follow up with Ophthalmology, Podiatrist at least yearly or according to recommendations, and advised to  stay away from smoking. I have recommended yearly flu vaccine and pneumonia vaccination at least every 5 years;  and  sleep for at least 7 hours a day.  I advised patient to maintain close follow up with their PCP for primary  care needs.  Patient is asked to bring meter and  blood glucose logs during their next visit.   Follow up plan: Return in about 3 months (around 01/21/2016) for diabetes, high blood pressure, high cholesterol, underactive thyroid, follow up with pre-visit labs, meter, and logs.  Marquis Lunch, MD Phone: [phone]  Fax: [phone]   10/22/2015, 12:09 PM

## 2015-10-28 ENCOUNTER — Encounter: Payer: Self-pay | Admitting: "Endocrinology

## 2015-11-04 ENCOUNTER — Other Ambulatory Visit: Payer: Self-pay

## 2015-11-04 MED ORDER — DICYCLOMINE HCL 10 MG PO CAPS: ORAL_CAPSULE | ORAL | Status: DC

## 2015-11-04 MED ORDER — LUBIPROSTONE 24 MCG PO CAPS: 24.0000 ug | ORAL_CAPSULE | Freq: Two times a day (BID) | ORAL | Status: DC

## 2015-11-05 ENCOUNTER — Telehealth: Payer: Self-pay

## 2015-11-05 NOTE — Telephone Encounter (Signed)
Pt states that she has been having some high readings. She has not been testing at specific times. I advised her to start testing again ac & hs. Does she need to come in for a visit?

## 2015-11-06 NOTE — Telephone Encounter (Signed)
If she has premeal readings above 200 x 3 , she may have to come.

## 2015-11-07 NOTE — Telephone Encounter (Signed)
Pt made appt

## 2015-11-13 ENCOUNTER — Ambulatory Visit (INDEPENDENT_AMBULATORY_CARE_PROVIDER_SITE_OTHER): Payer: Medicare Other | Admitting: "Endocrinology

## 2015-11-13 ENCOUNTER — Encounter: Payer: Self-pay | Admitting: "Endocrinology

## 2015-11-13 VITALS — BP 136/70 | HR 70 | Ht 64.0 in | Wt 236.0 lb

## 2015-11-13 DIAGNOSIS — E785 Hyperlipidemia, unspecified: Secondary | ICD-10-CM

## 2015-11-13 DIAGNOSIS — E1159 Type 2 diabetes mellitus with other circulatory complications: Secondary | ICD-10-CM

## 2015-11-13 DIAGNOSIS — I10 Essential (primary) hypertension: Secondary | ICD-10-CM

## 2015-11-13 DIAGNOSIS — E038 Other specified hypothyroidism: Secondary | ICD-10-CM

## 2015-11-13 MED ORDER — CANAGLIFLOZIN 100 MG PO TABS: 100.0000 mg | ORAL_TABLET | Freq: Every day | ORAL | Status: DC

## 2015-11-13 NOTE — Patient Instructions (Signed)

## 2015-11-13 NOTE — Progress Notes (Signed)
Subjective:    Patient ID: Karen Mueller, female    DOB: 1946/02/21,    Past Medical History  Diagnosis Date  . Diabetes (HCC)   . Hypercholesterolemia   . Hypertension   . GERD (gastroesophageal reflux disease)   . Sleep apnea   . Asthma   . Heart disease   . HIV (human immunodeficiency virus infection) (HCC)   . Hypothyroidism   . Schizophrenia (HCC)   . Depression with anxiety   . Chronic back pain   . Spinal stenosis   . Arthritis    Past Surgical History  Procedure Laterality Date  . Coronary angioplasty with stent placement      X 2  . Tubal ligation    . Hernia repair      umbilical  . Colonoscopy with propofol N/A 01/16/2015    RMR: internal hemorrhoids/left-side diverticulosis, segmental biopsies negative  . Esophagogastroduodenoscopy (egd) with propofol N/A 01/16/2015    ACZ:YSAYTKZ cervical esophageal with s/p dilation/HH. gastric erosions, benign and no h.pylori  . Maloney dilation N/A 01/16/2015    Procedure: [name] DILATION;  Surgeon: Corbin Ade, MD;  Location: [facility];  Service: Endoscopy;  Laterality: N/A;  54/  . Biopsy N/A 01/16/2015    Procedure: BIOPSY;  Surgeon: Corbin Ade, MD;  Location: [facility];  Service: Endoscopy;  Laterality: N/A;  Gastric, Ascending, Descending/Sigmoid   Social History   Social History  . Marital Status: Single    Spouse Name: N/A  . Number of Children: N/A  . Years of Education: N/A   Social History Main Topics  . Smoking status: Never Smoker   . Smokeless tobacco: None     Comment: Never smoked  . Alcohol Use: No  . Drug Use: No  . Sexual Activity: No   Other Topics Concern  . None   Social History Narrative   Outpatient Encounter Prescriptions as of 11/13/2015  Medication Sig  . albuterol (PROVENTIL HFA;VENTOLIN HFA) 108 (90 BASE) MCG/ACT inhaler Inhale 2 puffs into the lungs every 4 (four) hours as needed for wheezing or shortness of breath.  Marland Kitchen albuterol (PROVENTIL) (2.5 MG/3ML) 0.083% nebulizer  solution Take 2.5 mg by nebulization every 6 (six) hours as needed for wheezing or shortness of breath.  Marland Kitchen amLODipine (NORVASC) 10 MG tablet Take 10 mg by mouth daily.  Marland Kitchen aspirin 81 MG tablet Take 81 mg by mouth daily.  . benztropine (COGENTIN) 1 MG tablet 1 mg  . canagliflozin (INVOKANA) 100 MG TABS tablet Take 1 tablet (100 mg total) by mouth daily before breakfast.  . clopidogrel (PLAVIX) 75 MG tablet Take 75 mg by mouth daily.  . cyanocobalamin (,VITAMIN B-12,) 1000 MCG/ML injection Inject 1,000 mcg into the muscle once.  Marland Kitchen dexlansoprazole (DEXILANT) 60 MG capsule Take by mouth.  . dicyclomine (BENTYL) 10 MG capsule TAKE 1 CAPSULE(10 MG) BY MOUTH FOUR TIMES DAILY BEFORE MEALS AND AT BEDTIME as needed for cramps/diarrhea  . emtricitabine-tenofovir (TRUVADA) 200-300 MG per tablet Take 1 tablet by mouth daily.  . ergocalciferol (VITAMIN D2) 50000 UNITS capsule Take 50,000 Units by mouth once a week.  . gabapentin (NEURONTIN) 600 MG tablet Take 600 mg by mouth at bedtime.  . hydrOXYzine (VISTARIL) 50 MG capsule 3 (three) times daily before meals. 50 mg  . Insulin Glargine (LANTUS SOLOSTAR) 100 UNIT/ML Solostar Pen Inject 20 Units into the skin every morning.  Marland Kitchen levothyroxine (SYNTHROID, LEVOTHROID) 75 MCG tablet Take 75 mcg by mouth daily before breakfast.  . lubiprostone (  AMITIZA) 24 MCG capsule Take 1 capsule (24 mcg total) by mouth 2 (two) times daily with a meal.  . metoprolol tartrate (LOPRESSOR) 25 MG tablet Take by mouth 2 (two) times daily.   . mirabegron ER (MYRBETRIQ) 50 MG TB24 tablet Take 50 mg by mouth daily.  . montelukast (SINGULAIR) 10 MG tablet Take 10 mg by mouth at bedtime.  . Multiple Vitamins-Minerals (CERTA PLUS) TABS Take 1 tablet by mouth daily.  . potassium chloride (K-DUR,KLOR-CON) 10 MEQ tablet Take 10 mEq by mouth 2 (two) times daily.  . pravastatin (PRAVACHOL) 40 MG tablet Take 40 mg by mouth daily.  . Probiotic Product (ALIGN) 4 MG CAPS Take 1 capsule (4 mg total)  by mouth daily.  . QUEtiapine (SEROQUEL) 300 MG tablet Take 600 mg by mouth at bedtime.  . ramelteon (ROZEREM) 8 MG tablet Take by mouth.  . sertraline (ZOLOFT) 25 MG tablet 50 mg daily.   . sitaGLIPtin (JANUVIA) 50 MG tablet Take 100 mg by mouth daily.   . SUSTIVA 600 MG tablet   . tiZANidine (ZANAFLEX) 4 MG tablet Take 4 mg by mouth daily.   . valsartan (DIOVAN) 320 MG tablet Take 320 mg by mouth daily.   No facility-administered encounter medications on file as of 11/13/2015.   ALLERGIES: Allergies  Allergen Reactions  . Metronidazole Other (See Comments)    And itching  . Sulfa Antibiotics Other (See Comments)    And itching  . Benadryl [Diphenhydramine Hcl] Hives  . Other Other (See Comments)    Other Reaction: Allergy; cauliflower   VACCINATION STATUS:  There is no immunization history on file for this patient.  Diabetes She presents for her follow-up diabetic visit. She has type 2 diabetes mellitus. Her disease course has been worsening. Pertinent negatives for hypoglycemia include no confusion, headaches, pallor or seizures. Associated symptoms include fatigue. Pertinent negatives for diabetes include no chest pain, no polydipsia, no polyphagia and no polyuria. Symptoms are worsening. Risk factors for coronary artery disease include diabetes mellitus, dyslipidemia, hypertension, obesity and sedentary lifestyle. Current diabetic treatment includes insulin injections and oral agent (monotherapy) (She is unreliable to use insulin. She reports that she injects insulin at random. She did not monitor blood glucose in the last 3 weeks at least. She did not bring her meter nor logs to review.). She is compliant with treatment some of the time. She is following a generally unhealthy diet. When asked about meal planning, she reported none. She has had a previous visit with a dietitian. She never participates in exercise. Her overall blood glucose range is >200 mg/dl. (She monitored blood  glucose randomly showing persistent hyperglycemia above 250.   ) An ACE inhibitor/angiotensin II receptor blocker is being taken.  Hyperlipidemia This is a chronic problem. The current episode started more than 1 year ago. Exacerbating diseases include diabetes and obesity. Pertinent negatives include no chest pain, myalgias or shortness of breath. Risk factors for coronary artery disease include dyslipidemia, diabetes mellitus and hypertension.  Hypertension This is a chronic problem. The current episode started more than 1 year ago. Pertinent negatives include no chest pain, headaches, palpitations or shortness of breath. Risk factors for coronary artery disease include dyslipidemia, diabetes mellitus and sedentary lifestyle. Past treatments include angiotensin blockers. Hypertensive end-organ damage includes CAD/MI.     Review of Systems  Constitutional: Positive for fatigue. Negative for unexpected weight change.  HENT: Negative for trouble swallowing and voice change.   Eyes: Negative for visual disturbance.  Respiratory: Negative  for cough, shortness of breath and wheezing.   Cardiovascular: Negative for chest pain, palpitations and leg swelling.  Gastrointestinal: Negative for nausea, vomiting and diarrhea.  Endocrine: Negative for cold intolerance, heat intolerance, polydipsia, polyphagia and polyuria.  Musculoskeletal: Negative for myalgias and arthralgias.  Skin: Negative for color change, pallor, rash and wound.  Neurological: Negative for seizures and headaches.  Psychiatric/Behavioral: Negative for suicidal ideas and confusion.    Objective:    BP 136/70 mmHg  Pulse 70  Ht 5\' 4"  (1.626 m)  Wt 236 lb (107.049 kg)  BMI 40.49 kg/m2  SpO2 98%  Wt Readings from Last 3 Encounters:  11/13/15 236 lb (107.049 kg)  10/22/15 239 lb (108.41 kg)  10/22/15 239 lb (108.41 kg)    Physical Exam  Constitutional: She is oriented to person, place, and time. She appears well-developed.   HENT:  Head: Normocephalic and atraumatic.  Eyes: EOM are normal.  Neck: Normal range of motion. Neck supple. No tracheal deviation present. No thyromegaly present.  Cardiovascular: Normal rate and regular rhythm.   Pulmonary/Chest: Effort normal and breath sounds normal.  Abdominal: Soft. Bowel sounds are normal. There is no tenderness. There is no guarding.  Musculoskeletal: She exhibits no edema.  She uses her walker to get around.  Neurological: She is alert and oriented to person, place, and time. She has normal reflexes. No cranial nerve deficit. Coordination normal.  She uses her walker to get around.  Skin: Skin is warm and dry. No rash noted. No erythema. No pallor.  Psychiatric: She has a normal mood and affect. Judgment normal.    Results for orders placed or performed in visit on 10/22/15  Hemoglobin A1c  Result Value Ref Range   Hemoglobin A1C 7.8    Complete Blood Count (Most recent): Lab Results  Component Value Date   WBC 4.5 01/13/2015   HGB 11.8* 01/13/2015   HCT 35.7* 01/13/2015   MCV 86.7 01/13/2015   PLT 305 01/13/2015   Chemistry (most recent): Lab Results  Component Value Date   NA 136 01/13/2015   K 4.7 01/13/2015   CL 103 01/13/2015   CO2 22 01/13/2015   BUN 20 01/13/2015   CREATININE 1.20* 01/13/2015   Diabetic Labs (most recent): Lab Results  Component Value Date   HGBA1C 7.8 10/15/2015   HGBA1C 7.9 07/04/2015   HGBA1C 8.2* 02/26/2015   Assessment & Plan:   1. Type 2 diabetes mellitus with vascular disease (HCC)   Her diabetes is  complicated by coronary artery disease. Patient came with persistently elevated glucose profile. -Her A1c during her last visit was A1c 7.8% improving from 8.2 %.  Patient is struggling to follow monitoring and insulin administration instructions.  She has significant social problem, she forgets to eat on time and to monitor on time. Ideally, she needs assistance with her diabetes by a skilled personnel  either at home or at group home. However she is against placement at this point. -.  Recent labs reviewed. - Patient remains at a high risk for more acute and chronic complications of diabetes which include CAD, CVA, CKD, retinopathy, and neuropathy. These are all discussed in detail with the patient.  - I have re-counseled the patient on diet management and weight loss  by adopting a carbohydrate restricted / protein rich  Diet. - Patient is advised to stick to a routine mealtimes to eat 3 meals  a day and avoid unnecessary snacks ( to snack only to correct hypoglycemia).  - Suggestion  is made for patient to avoid simple carbohydrates   from their diet including Cakes , Desserts, Ice Cream,  Soda (  diet and regular) , Sweet Tea , Candies,  Chips, Cookies, Artificial Sweeteners,   and "Sugar-free" Products .  This will help patient to have stable blood glucose profile and potentially avoid unintended  Weight gain.  - The patient  will be  scheduled with Norm Salt, RDN, CDE for individualized DM education. - I have approached patient with the following individualized plan to manage diabetes and patient agrees.  - With arrangement to see her in 2 weeks, I will cautiously resume Lantus 20 units every morning with breakfast. -I will add Invokana 100mg  po qam, side effects and precautions discussed with her.  -I urged her to monitor blood glucose at least 2 times a day before breakfast and at bedtime . -Patient is encouraged to call clinic for blood glucose levels less than 70 or above 300 mg /dl. - I will continue Januvia 50 mg by mouth daily, therapeutically suitable for patient. -She does not tolerate metformin.  - Patient specific target  for A1c; LDL, HDL, Triglycerides, and  Waist Circumference were discussed in detail.  2) BP/HTN:  uncontrolled. Continue current medications including ARB.  3) Lipids/HPL: continue statins. 4)  Weight/Diet: CDE consult in progress, exercise, and  carbohydrates information provided.  5) Chronic Care/Health Maintenance:  -Patient is ARB and Statin medications and encouraged to continue to follow up with Ophthalmology, Podiatrist at least yearly or according to recommendations, and advised to  stay away from smoking. I have recommended yearly flu vaccine and pneumonia vaccination at least every 5 years;  and  sleep for at least 7 hours a day.  I advised patient to maintain close follow up with their PCP for primary care needs.  Patient is asked to bring meter and  blood glucose logs during their next visit.   Follow up plan: Return in about 2 weeks (around 11/27/2015) for diabetes, high blood pressure, high cholesterol, underactive thyroid, follow up with meter and logs- no labs.  Marquis Lunch, MD Phone: [phone]  Fax: [phone]   11/13/2015, 3:22 PM

## 2015-11-14 ENCOUNTER — Telehealth: Payer: Self-pay

## 2015-11-14 NOTE — Telephone Encounter (Signed)
Pharmacy notified.

## 2015-11-14 NOTE — Telephone Encounter (Signed)
[facility] is questions should Karen Mueller be on Januvia or Invokana?

## 2015-11-14 NOTE — Telephone Encounter (Signed)
She needs both Januvia and Invokana.

## 2015-11-26 ENCOUNTER — Ambulatory Visit: Payer: Medicare Other | Admitting: "Endocrinology

## 2015-12-03 ENCOUNTER — Other Ambulatory Visit: Payer: Self-pay

## 2015-12-03 NOTE — Telephone Encounter (Signed)
Pharmacy is requesting that we send in a 90 days supply of Amitiza 24 mcg. Please advise

## 2015-12-04 MED ORDER — LUBIPROSTONE 24 MCG PO CAPS: 24.0000 ug | ORAL_CAPSULE | Freq: Two times a day (BID) | ORAL | Status: DC

## 2015-12-17 ENCOUNTER — Ambulatory Visit (INDEPENDENT_AMBULATORY_CARE_PROVIDER_SITE_OTHER): Payer: Medicare Other | Admitting: "Endocrinology

## 2015-12-17 ENCOUNTER — Telehealth: Payer: Self-pay | Admitting: Nutrition

## 2015-12-17 ENCOUNTER — Encounter: Payer: Self-pay | Admitting: "Endocrinology

## 2015-12-17 ENCOUNTER — Other Ambulatory Visit: Payer: Self-pay

## 2015-12-17 ENCOUNTER — Telehealth: Payer: Self-pay

## 2015-12-17 VITALS — BP 136/82 | HR 89 | Ht 64.0 in

## 2015-12-17 DIAGNOSIS — E785 Hyperlipidemia, unspecified: Secondary | ICD-10-CM | POA: Diagnosis not present

## 2015-12-17 DIAGNOSIS — E6609 Other obesity due to excess calories: Secondary | ICD-10-CM | POA: Diagnosis not present

## 2015-12-17 DIAGNOSIS — E1159 Type 2 diabetes mellitus with other circulatory complications: Secondary | ICD-10-CM

## 2015-12-17 DIAGNOSIS — I10 Essential (primary) hypertension: Secondary | ICD-10-CM

## 2015-12-17 MED ORDER — ONETOUCH ULTRASOFT LANCETS MISC
Status: DC
Start: 2015-12-17 — End: 2017-02-08

## 2015-12-17 NOTE — Telephone Encounter (Signed)
Pt left message w/ front desk that we gave her the wrong meter. While the pt was in the office today we gave her instructions on how to use the Contour meter. She also requested that we give her the one touch lancet device because she knew how to use this.  What other advise can you give to explain this to pt.

## 2015-12-17 NOTE — Telephone Encounter (Signed)
Per Dr Fransico HimNida pt is to stop Lantus since she is unable to test BG. Pt was confused about whether or not she has Januvia and Invokana. He medications are prepackaged [facility] and I also advised them to D/C the Lantus. Pt notified.

## 2015-12-17 NOTE — Patient Instructions (Signed)

## 2015-12-17 NOTE — Telephone Encounter (Signed)
I reviewed her diet with her and her caseworker also for better diet compliance. Casworker reports she is not compliant with diet at home. Aides are there to help her prepare meals but she chooses to have fast foods. Observed she was given a lancet device that she is familiar with to be able to test blood sugars. She verbalized understanding of how to use it with Sherilyn BankerKim French, LPN and her case worker in the room. Encouraged pt to follow diet instructions to help improve DM and reduce complications. She verbalized understanding and said she would try.

## 2015-12-17 NOTE — Progress Notes (Signed)
Subjective:    Patient ID: Karen Mueller, female    DOB: 07/31/1945,    Past Medical History  Diagnosis Date  . Diabetes (HCC)   . Hypercholesterolemia   . Hypertension   . GERD (gastroesophageal reflux disease)   . Sleep apnea   . Asthma   . Heart disease   . HIV (human immunodeficiency virus infection) (HCC)   . Hypothyroidism   . Schizophrenia (HCC)   . Depression with anxiety   . Chronic back pain   . Spinal stenosis   . Arthritis    Past Surgical History  Procedure Laterality Date  . Coronary angioplasty with stent placement      X 2  . Tubal ligation    . Hernia repair      umbilical  . Colonoscopy with propofol N/A 01/16/2015    RMR: internal hemorrhoids/left-side diverticulosis, segmental biopsies negative  . Esophagogastroduodenoscopy (egd) with propofol N/A 01/16/2015    [id number] cervical esophageal with s/p dilation/HH. gastric erosions, benign and no h.pylori  . Maloney dilation N/A 01/16/2015    Procedure: [name] DILATION;  Surgeon: Corbin Ade, MD;  Location: [facility];  Service: Endoscopy;  Laterality: N/A;  54/  . Biopsy N/A 01/16/2015    Procedure: BIOPSY;  Surgeon: Corbin Ade, MD;  Location: [facility];  Service: Endoscopy;  Laterality: N/A;  Gastric, Ascending, Descending/Sigmoid   Social History   Social History  . Marital Status: Single    Spouse Name: N/A  . Number of Children: N/A  . Years of Education: N/A   Social History Main Topics  . Smoking status: Never Smoker   . Smokeless tobacco: None     Comment: Never smoked  . Alcohol Use: No  . Drug Use: No  . Sexual Activity: No   Other Topics Concern  . None   Social History Narrative   Outpatient Encounter Prescriptions as of 12/17/2015  Medication Sig  . albuterol (PROVENTIL HFA;VENTOLIN HFA) 108 (90 BASE) MCG/ACT inhaler Inhale 2 puffs into the lungs every 4 (four) hours as needed for wheezing or shortness of breath.  Marland Kitchen albuterol (PROVENTIL) (2.5 MG/3ML) 0.083% nebulizer  solution Take 2.5 mg by nebulization every 6 (six) hours as needed for wheezing or shortness of breath.  Marland Kitchen amLODipine (NORVASC) 10 MG tablet Take 10 mg by mouth daily.  Marland Kitchen aspirin 81 MG tablet Take 81 mg by mouth daily.  . benztropine (COGENTIN) 1 MG tablet 1 mg  . canagliflozin (INVOKANA) 100 MG TABS tablet Take 1 tablet (100 mg total) by mouth daily before breakfast.  . clopidogrel (PLAVIX) 75 MG tablet Take 75 mg by mouth daily.  . cyanocobalamin (,VITAMIN B-12,) 1000 MCG/ML injection Inject 1,000 mcg into the muscle once.  Marland Kitchen dexlansoprazole (DEXILANT) 60 MG capsule Take by mouth.  . dicyclomine (BENTYL) 10 MG capsule TAKE 1 CAPSULE(10 MG) BY MOUTH FOUR TIMES DAILY BEFORE MEALS AND AT BEDTIME as needed for cramps/diarrhea  . emtricitabine-tenofovir (TRUVADA) 200-300 MG per tablet Take 1 tablet by mouth daily.  . ergocalciferol (VITAMIN D2) 50000 UNITS capsule Take 50,000 Units by mouth once a week.  . gabapentin (NEURONTIN) 600 MG tablet Take 600 mg by mouth at bedtime.  . hydrOXYzine (VISTARIL) 50 MG capsule 3 (three) times daily before meals. 50 mg  . Insulin Glargine (LANTUS SOLOSTAR) 100 UNIT/ML Solostar Pen Inject 20 Units into the skin every morning.  Marland Kitchen levothyroxine (SYNTHROID, LEVOTHROID) 75 MCG tablet Take 75 mcg by mouth daily before breakfast.  . lubiprostone (  AMITIZA) 24 MCG capsule Take 1 capsule (24 mcg total) by mouth 2 (two) times daily with a meal.  . metoprolol tartrate (LOPRESSOR) 25 MG tablet Take by mouth 2 (two) times daily.   . mirabegron ER (MYRBETRIQ) 50 MG TB24 tablet Take 50 mg by mouth daily.  . montelukast (SINGULAIR) 10 MG tablet Take 10 mg by mouth at bedtime.  . Multiple Vitamins-Minerals (CERTA PLUS) TABS Take 1 tablet by mouth daily.  . potassium chloride (K-DUR,KLOR-CON) 10 MEQ tablet Take 10 mEq by mouth 2 (two) times daily.  . pravastatin (PRAVACHOL) 40 MG tablet Take 40 mg by mouth daily.  . Probiotic Product (ALIGN) 4 MG CAPS Take 1 capsule (4 mg total)  by mouth daily.  . QUEtiapine (SEROQUEL) 300 MG tablet Take 600 mg by mouth at bedtime.  . ramelteon (ROZEREM) 8 MG tablet Take by mouth.  . sertraline (ZOLOFT) 25 MG tablet 50 mg daily.   . sitaGLIPtin (JANUVIA) 50 MG tablet Take 100 mg by mouth daily.   . SUSTIVA 600 MG tablet   . tiZANidine (ZANAFLEX) 4 MG tablet Take 4 mg by mouth daily.   . valsartan (DIOVAN) 320 MG tablet Take 320 mg by mouth daily.   No facility-administered encounter medications on file as of 12/17/2015.   ALLERGIES: Allergies  Allergen Reactions  . Metronidazole Other (See Comments)    And itching  . Sulfa Antibiotics Other (See Comments)    And itching  . Benadryl [Diphenhydramine Hcl] Hives  . Other Other (See Comments)    Other Reaction: Allergy; cauliflower   VACCINATION STATUS:  There is no immunization history on file for this patient.  Diabetes She presents for her follow-up diabetic visit. She has type 2 diabetes mellitus. Her disease course has been worsening (Since last visit, she fell and broke her right leg.). Pertinent negatives for hypoglycemia include no confusion, headaches, pallor or seizures. Associated symptoms include fatigue. Pertinent negatives for diabetes include no chest pain, no polydipsia, no polyphagia and no polyuria. Symptoms are worsening. Diabetic complications include peripheral neuropathy and PVD. Risk factors for coronary artery disease include diabetes mellitus, dyslipidemia, hypertension, obesity and sedentary lifestyle. Current diabetic treatment includes insulin injections and oral agent (monotherapy) (She is unreliable to use insulin. She reports that she injects insulin at random. She did not monitor blood glucose in the last 3 weeks at least. She did not bring her meter nor logs to review.). She is compliant with treatment some of the time. She is following a generally unhealthy diet. When asked about meal planning, she reported none. She has had a previous visit with a  dietitian. She never participates in exercise. There is no change (She monitored only 5 times in the last 15 days showing blood glucose persistently above 200 mg/dL. ) in her home blood glucose trend. Her overall blood glucose range is >200 mg/dl. (   ) An ACE inhibitor/angiotensin II receptor blocker is being taken.  Hyperlipidemia This is a chronic problem. The current episode started more than 1 year ago. Exacerbating diseases include diabetes and obesity. Pertinent negatives include no chest pain, myalgias or shortness of breath. Risk factors for coronary artery disease include dyslipidemia, diabetes mellitus and hypertension.  Hypertension This is a chronic problem. The current episode started more than 1 year ago. Pertinent negatives include no chest pain, headaches, palpitations or shortness of breath. Risk factors for coronary artery disease include dyslipidemia, diabetes mellitus and sedentary lifestyle. Past treatments include angiotensin blockers. Hypertensive end-organ damage includes CAD/MI  and PVD.     Review of Systems  Constitutional: Positive for fatigue. Negative for unexpected weight change.  HENT: Negative for trouble swallowing and voice change.   Eyes: Negative for visual disturbance.  Respiratory: Negative for cough, shortness of breath and wheezing.   Cardiovascular: Negative for chest pain, palpitations and leg swelling.  Gastrointestinal: Negative for nausea, vomiting and diarrhea.  Endocrine: Negative for cold intolerance, heat intolerance, polydipsia, polyphagia and polyuria.  Musculoskeletal: Positive for gait problem. Negative for myalgias and arthralgias.       She is on a wheelchair now due to fracture of her right leg.  Skin: Negative for color change, pallor, rash and wound.  Neurological: Negative for seizures and headaches.  Psychiatric/Behavioral: Negative for suicidal ideas and confusion.    Objective:    BP 136/82 mmHg  Pulse 89  Ht  (1.626 m)   Wt Readings from Last 3 Encounters:  11/13/15 236 lb (107.049 kg)  10/22/15 239 lb (108.41 kg)  10/22/15 239 lb (108.41 kg)    Physical Exam  Constitutional: She is oriented to person, place, and time. She appears well-developed.  HENT:  Head: Normocephalic and atraumatic.  Eyes: EOM are normal.  Neck: Normal range of motion. Neck supple. No tracheal deviation present. No thyromegaly present.  Cardiovascular: Normal rate and regular rhythm.   Pulmonary/Chest: Effort normal and breath sounds normal.  Abdominal: Soft. Bowel sounds are normal. There is no tenderness. There is no guarding.  Musculoskeletal: She exhibits no edema.  She is wheelchair bound due to recent fracture of right leg.Marland Kitchen  Neurological: She is alert and oriented to person, place, and time. She has normal reflexes. No cranial nerve deficit. Coordination normal.  Skin: Skin is warm and dry. No rash noted. No erythema. No pallor.  Psychiatric: She has a normal mood and affect. Judgment normal.    Results for orders placed or performed in visit on 10/22/15  Hemoglobin A1c  Result Value Ref Range   Hemoglobin A1C 7.8    Complete Blood Count (Most recent): Lab Results  Component Value Date   WBC 4.5 01/13/2015   HGB 11.8* 01/13/2015   HCT 35.7* 01/13/2015   MCV 86.7 01/13/2015   PLT 305 01/13/2015   Chemistry (most recent): Lab Results  Component Value Date   NA 136 01/13/2015   K 4.7 01/13/2015   CL 103 01/13/2015   CO2 22 01/13/2015   BUN 20 01/13/2015   CREATININE 1.20* 01/13/2015   Diabetic Labs (most recent): Lab Results  Component Value Date   HGBA1C 7.8 10/15/2015   HGBA1C 7.9 07/04/2015   HGBA1C 8.2* 02/26/2015   Assessment & Plan:   1. Type 2 diabetes mellitus with vascular disease (HCC)   Her diabetes is  complicated by coronary artery disease. Patient came with persistently elevated glucose profile. -Her  Last visit A1c was 7.8% improving from 8.2 %.  Patient is struggling to follow  monitoring and insulin administration instructions.  She has significant social problem, she forgets to eat on time and to monitor on time. Ideally, she needs assistance with her diabetes by a skilled personnel either at home or at group home. However she is against placement at this point. -.  Recent labs reviewed. - Patient remains at a high risk for more acute and chronic complications of diabetes which include CAD, CVA, CKD, retinopathy, and neuropathy. These are all discussed in detail with the patient.  - I have re-counseled the patient on diet management and weight loss  by adopting a carbohydrate restricted / protein rich  Diet. - Patient is advised to stick to a routine mealtimes to eat 3 meals  a day and avoid unnecessary snacks ( to snack only to correct hypoglycemia).  - Suggestion is made for patient to avoid simple carbohydrates   from their diet including Cakes , Desserts, Ice Cream,  Soda (  diet and regular) , Sweet Tea , Candies,  Chips, Cookies, Artificial Sweeteners,   and "Sugar-free" Products .  This will help patient to have stable blood glucose profile and potentially avoid unintended  Weight gain.  - The patient  will be  scheduled with Norm SaltPenny Crumpton, RDN, CDE for individualized DM education. - I have approached patient with the following individualized plan to manage diabetes and patient agrees.  - With arrangement to see her in 6 weeks, I will cautiously continue  Lantus 20 units every morning with breakfast. -I will continue  Invokana 100mg  po qam, side effects and precautions discussed with her.  - Myelf and my nurse took time to educate her and her social worker on the use of a meter. -I urged her to monitor blood glucose at least 2 times a day before breakfast and at bedtime . -Patient is encouraged to call clinic for blood glucose levels less than 70 or above 300 mg /dl. - I will continue Januvia 50 mg by mouth daily, therapeutically suitable for patient. -She  does not tolerate metformin.  - Patient specific target  for A1c; LDL, HDL, Triglycerides, and  Waist Circumference were discussed in detail.  2) BP/HTN:  uncontrolled. Continue current medications including ARB.  3) Lipids/HPL: continue statins. 4)  Weight/Diet: CDE consult in progress, exercise, and carbohydrates information provided.  5) Chronic Care/Health Maintenance:  -Patient is ARB and Statin medications and encouraged to continue to follow up with Ophthalmology, Podiatrist at least yearly or according to recommendations, and advised to  stay away from smoking. I have recommended yearly flu vaccine and pneumonia vaccination at least every 5 years;  and  sleep for at least 7 hours a day.  I advised patient to maintain close follow up with their PCP for primary care needs.  Patient is asked to bring meter and  blood glucose logs during their next visit.   Follow up plan: Return in about 6 weeks (around 01/28/2016) for meter, and logs.  Marquis LunchGebre Nida, MD Phone: [phone]  Fax: [phone]   12/17/2015, 12:56 PM

## 2016-01-19 NOTE — Telephone Encounter (Signed)
Called pt to tell her when her next appointment is.

## 2016-01-21 LAB — TSH: TSH: 1.8 u[IU]/mL (ref <=5.90)

## 2016-01-21 LAB — HEMOGLOBIN A1C: Hemoglobin A1C: 8.1

## 2016-01-23 ENCOUNTER — Telehealth: Payer: Self-pay | Admitting: Nutrition

## 2016-01-23 ENCOUNTER — Telehealth: Payer: Self-pay

## 2016-01-23 NOTE — Telephone Encounter (Signed)
Karen Mueller called me to back. Informed her I canceled [name]'s appt with me on Monday and will see her on Wednesday after appt with DR. Fransico Him. [name] verbalized she would bring Karen Mueller to her appt with DR. Nida On Wednesday, Aug 2nd.

## 2016-01-23 NOTE — Telephone Encounter (Signed)
-----   Message from Mare Loan, RD sent at 01/23/2016 12:20 PM EDT ----- Regarding: [place] and high blood sugar Hi Karen Mueller called wondering what she should do. She has a severe headache and her BS last night was 500 mg/dl or something. She states she was told to stop taking her insulin.  Can you please call her and advise? Thanks [employer]

## 2016-01-23 NOTE — Telephone Encounter (Signed)
Pt called and spoke w/ Boyd Kerbs that her BG was 500 last night. When I spoke the pt she would first tell me that she tested her BG but then she would tell me that she was never given any supplies. She states that she is not going to use the meter that she has supplies for. Eventually she told me that EMS took her BG last night but she would not go with them. I do not believe she is testing even though she says she is. She will say she test and then she will say that she doesn't know because her "meter broke". Pt sees Boyd Kerbs on 01/26/16 and Dr Fransico Him on 01-28-16.

## 2016-01-23 NOTE — Telephone Encounter (Signed)
Pt notified by Norm Salt.

## 2016-01-23 NOTE — Telephone Encounter (Signed)
PT called concerning 'a meter' and testing blood sugars. Discussed with DR. Nida and he advised me to tell her to not worry about testing blood sugars and not take Lantus but to take her Invokana and her Januvia as prescribed and he would see her on her Appt on Wednesday, Aug 2nd. Pt notes her aid won't be able to bring her for that appt on Wednesday  and can only make it on Monday for my appt. Advised pt that I can cancel her appt with me on Monday and would see her on Wednesday with her appt from DR. Nida, since that is the more important appt. Reminded her she need to get her lab work done prior to visit with DR. Fransico Him. She noted she cant remember if she got it done and was worried her aid can't bring her on Wednesday. She gave me Gean Maidens phone number to call to inquire if she can bring her to her appt with Dr. Fransico Him.   Called Vivian's cell number [phone] and left vm to return call to discuss Ms. Karen Mueller' appts.

## 2016-01-23 NOTE — Telephone Encounter (Signed)
Still I do not want her to take insulin simply because she may not use it safely. I want her to continue on Invokana 100 mg once a day and Januvia 50 mg once a day. She has to keep her appointment with me with repeat labs. We will discuss her options when she comes back.

## 2016-01-26 ENCOUNTER — Ambulatory Visit: Payer: Medicare Other | Admitting: Nutrition

## 2016-01-26 ENCOUNTER — Ambulatory Visit: Payer: Medicare Other | Admitting: "Endocrinology

## 2016-01-26 NOTE — Telephone Encounter (Signed)
Reminded pt to not take insulin or take blood sugars

## 2016-01-28 ENCOUNTER — Ambulatory Visit (INDEPENDENT_AMBULATORY_CARE_PROVIDER_SITE_OTHER): Payer: Medicare Other | Admitting: "Endocrinology

## 2016-01-28 ENCOUNTER — Encounter: Payer: Medicare Other | Attending: "Endocrinology | Admitting: Nutrition

## 2016-01-28 ENCOUNTER — Encounter: Payer: Self-pay | Admitting: "Endocrinology

## 2016-01-28 VITALS — BP 103/71 | HR 89 | Wt 227.4 lb

## 2016-01-28 DIAGNOSIS — E038 Other specified hypothyroidism: Secondary | ICD-10-CM

## 2016-01-28 DIAGNOSIS — I10 Essential (primary) hypertension: Secondary | ICD-10-CM

## 2016-01-28 DIAGNOSIS — E6609 Other obesity due to excess calories: Secondary | ICD-10-CM

## 2016-01-28 DIAGNOSIS — E785 Hyperlipidemia, unspecified: Secondary | ICD-10-CM | POA: Diagnosis not present

## 2016-01-28 DIAGNOSIS — E1159 Type 2 diabetes mellitus with other circulatory complications: Secondary | ICD-10-CM

## 2016-01-28 MED ORDER — LEVOTHYROXINE SODIUM 88 MCG PO TABS: 88.0000 ug | ORAL_TABLET | Freq: Every day | ORAL | 3 refills | Status: DC

## 2016-01-28 NOTE — Patient Instructions (Signed)
Goals 1. Cut out junk food 2. Increase fresh and vegetables 3. Drink more water-5 bottles per day. 4. No snacks between meals.

## 2016-01-28 NOTE — Patient Instructions (Signed)

## 2016-01-28 NOTE — Progress Notes (Signed)
Subjective:    Patient ID: Karen Mueller, female    DOB: 1946/04/23,    Past Medical History:  Diagnosis Date  . Arthritis   . Asthma   . Chronic back pain   . Depression with anxiety   . Diabetes (HCC)   . GERD (gastroesophageal reflux disease)   . Heart disease   . HIV (human immunodeficiency virus infection) (HCC)   . Hypercholesterolemia   . Hypertension   . Hypothyroidism   . Schizophrenia (HCC)   . Sleep apnea   . Spinal stenosis    Past Surgical History:  Procedure Laterality Date  . BIOPSY N/A 01/16/2015   Procedure: BIOPSY;  Surgeon: Corbin Ade, MD;  Location: [facility];  Service: Endoscopy;  Laterality: N/A;  Gastric, Ascending, Descending/Sigmoid  . COLONOSCOPY WITH PROPOFOL N/A 01/16/2015   RMR: internal hemorrhoids/left-side diverticulosis, segmental biopsies negative  . CORONARY ANGIOPLASTY WITH STENT PLACEMENT     X 2  . ESOPHAGOGASTRODUODENOSCOPY (EGD) WITH PROPOFOL N/A 01/16/2015   YNW:GNFAOZH cervical esophageal with s/p dilation/HH. gastric erosions, benign and no h.pylori  . HERNIA REPAIR     umbilical  . MALONEY DILATION N/A 01/16/2015   Procedure: [name] DILATION;  Surgeon: Corbin Ade, MD;  Location: [facility];  Service: Endoscopy;  Laterality: N/A;  56/  . TUBAL LIGATION     Social History   Social History  . Marital status: Single    Spouse name: N/A  . Number of children: N/A  . Years of education: N/A   Social History Main Topics  . Smoking status: Never Smoker  . Smokeless tobacco: Not on file     Comment: Never smoked  . Alcohol use No  . Drug use: No  . Sexual activity: No   Other Topics Concern  . Not on file   Social History Narrative  . No narrative on file   Outpatient Encounter Prescriptions as of 01/28/2016  Medication Sig  . albuterol (PROVENTIL HFA;VENTOLIN HFA) 108 (90 BASE) MCG/ACT inhaler Inhale 2 puffs into the lungs every 4 (four) hours as needed for wheezing or shortness of breath.  Marland Kitchen albuterol (PROVENTIL) (2.5  MG/3ML) 0.083% nebulizer solution Take 2.5 mg by nebulization every 6 (six) hours as needed for wheezing or shortness of breath.  Marland Kitchen amLODipine (NORVASC) 10 MG tablet Take 10 mg by mouth daily.  Marland Kitchen aspirin 81 MG tablet Take 81 mg by mouth daily.  . benztropine (COGENTIN) 1 MG tablet 1 mg  . canagliflozin (INVOKANA) 100 MG TABS tablet Take 1 tablet (100 mg total) by mouth daily before breakfast.  . clopidogrel (PLAVIX) 75 MG tablet Take 75 mg by mouth daily.  . cyanocobalamin (,VITAMIN B-12,) 1000 MCG/ML injection Inject 1,000 mcg into the muscle once.  Marland Kitchen dexlansoprazole (DEXILANT) 60 MG capsule Take by mouth.  . dicyclomine (BENTYL) 10 MG capsule TAKE 1 CAPSULE(10 MG) BY MOUTH FOUR TIMES DAILY BEFORE MEALS AND AT BEDTIME as needed for cramps/diarrhea  . emtricitabine-tenofovir (TRUVADA) 200-300 MG per tablet Take 1 tablet by mouth daily.  . ergocalciferol (VITAMIN D2) 50000 UNITS capsule Take 50,000 Units by mouth once a week.  . gabapentin (NEURONTIN) 600 MG tablet Take 600 mg by mouth at bedtime.  . hydrOXYzine (VISTARIL) 50 MG capsule 3 (three) times daily before meals. 50 mg  . Lancets (ONETOUCH ULTRASOFT) lancets Use as instructed 4 x daily. E11.65  . levothyroxine (SYNTHROID, LEVOTHROID) 88 MCG tablet Take 1 tablet (88 mcg total) by mouth daily before breakfast.  .  lubiprostone (AMITIZA) 24 MCG capsule Take 1 capsule (24 mcg total) by mouth 2 (two) times daily with a meal.  . metoprolol tartrate (LOPRESSOR) 25 MG tablet Take by mouth 2 (two) times daily.   . mirabegron ER (MYRBETRIQ) 50 MG TB24 tablet Take 50 mg by mouth daily.  . montelukast (SINGULAIR) 10 MG tablet Take 10 mg by mouth at bedtime.  . Multiple Vitamins-Minerals (CERTA PLUS) TABS Take 1 tablet by mouth daily.  . potassium chloride (K-DUR,KLOR-CON) 10 MEQ tablet Take 10 mEq by mouth 2 (two) times daily.  . pravastatin (PRAVACHOL) 40 MG tablet Take 40 mg by mouth daily.  . Probiotic Product (ALIGN) 4 MG CAPS Take 1 capsule (4  mg total) by mouth daily.  . QUEtiapine (SEROQUEL) 300 MG tablet Take 600 mg by mouth at bedtime.  . ramelteon (ROZEREM) 8 MG tablet Take by mouth.  . sertraline (ZOLOFT) 25 MG tablet 50 mg daily.   . sitaGLIPtin (JANUVIA) 50 MG tablet Take 100 mg by mouth daily.   . SUSTIVA 600 MG tablet   . tiZANidine (ZANAFLEX) 4 MG tablet Take 4 mg by mouth daily.   . valsartan (DIOVAN) 320 MG tablet Take 320 mg by mouth daily.  . [DISCONTINUED] Insulin Glargine (LANTUS SOLOSTAR) 100 UNIT/ML Solostar Pen Inject 20 Units into the skin every morning.  . [DISCONTINUED] levothyroxine (SYNTHROID, LEVOTHROID) 75 MCG tablet Take 75 mcg by mouth daily before breakfast.   No facility-administered encounter medications on file as of 01/28/2016.    ALLERGIES: Allergies  Allergen Reactions  . Metronidazole Other (See Comments)    And itching  . Sulfa Antibiotics Other (See Comments)    And itching  . Benadryl [Diphenhydramine Hcl] Hives  . Other Other (See Comments)    Other Reaction: Allergy; cauliflower   VACCINATION STATUS:  There is no immunization history on file for this patient.  Diabetes  She presents for her follow-up diabetic visit. She has type 2 diabetes mellitus. Her disease course has been stable (Since last visit, she fell and broke her right leg.). Pertinent negatives for hypoglycemia include no confusion, headaches, pallor or seizures. Associated symptoms include fatigue. Pertinent negatives for diabetes include no chest pain, no polydipsia, no polyphagia and no polyuria. Symptoms are stable. Diabetic complications include peripheral neuropathy and PVD. Risk factors for coronary artery disease include diabetes mellitus, dyslipidemia, hypertension, obesity and sedentary lifestyle. Current diabetic treatment includes insulin injections and oral agent (monotherapy) (She is unreliable to use insulin. She reports that she injects insulin at random. She did not monitor blood glucose in the last 3 weeks  at least. She did not bring her meter nor logs to review.). She is compliant with treatment some of the time. She is following a generally unhealthy diet. When asked about meal planning, she reported none. She has had a previous visit with a dietitian. She never participates in exercise. There is no change (She monitored only 5 times in the last 15 days showing blood glucose persistently above 200 mg/dL. ) in her home blood glucose trend. (   ) An ACE inhibitor/angiotensin II receptor blocker is being taken.  Hyperlipidemia  This is a chronic problem. The current episode started more than 1 year ago. Exacerbating diseases include diabetes and obesity. Pertinent negatives include no chest pain, myalgias or shortness of breath. Risk factors for coronary artery disease include dyslipidemia, diabetes mellitus and hypertension.  Hypertension  This is a chronic problem. The current episode started more than 1 year ago. Pertinent negatives  include no chest pain, headaches, palpitations or shortness of breath. Risk factors for coronary artery disease include dyslipidemia, diabetes mellitus and sedentary lifestyle. Past treatments include angiotensin blockers. Hypertensive end-organ damage includes CAD/MI and PVD.     Review of Systems  Constitutional: Positive for fatigue. Negative for unexpected weight change.  HENT: Negative for trouble swallowing and voice change.   Eyes: Negative for visual disturbance.  Respiratory: Negative for cough, shortness of breath and wheezing.   Cardiovascular: Negative for chest pain, palpitations and leg swelling.  Gastrointestinal: Negative for diarrhea, nausea and vomiting.  Endocrine: Negative for cold intolerance, heat intolerance, polydipsia, polyphagia and polyuria.  Musculoskeletal: Positive for gait problem. Negative for arthralgias and myalgias.       She is using a walker to get around.  Skin: Negative for color change, pallor, rash and wound.  Neurological:  Negative for seizures and headaches.  Psychiatric/Behavioral: Negative for confusion and suicidal ideas.    Objective:    BP 103/71   Pulse 89   Wt 227 lb 6 oz (103.1 kg)   BMI 39.03 kg/m   Wt Readings from Last 3 Encounters:  01/28/16 227 lb 6 oz (103.1 kg)  11/13/15 236 lb (107 kg)  10/22/15 239 lb (108.4 kg)    Physical Exam  Constitutional: She is oriented to person, place, and time. She appears well-developed.  HENT:  Head: Normocephalic and atraumatic.  Eyes: EOM are normal.  Neck: Normal range of motion. Neck supple. No tracheal deviation present. No thyromegaly present.  Cardiovascular: Normal rate and regular rhythm.   Pulmonary/Chest: Effort normal and breath sounds normal.  Abdominal: Soft. Bowel sounds are normal. There is no tenderness. There is no guarding.  Musculoskeletal: She exhibits no edema.  She is using a walker to get around. She is out of the wheelchair that she used during last visit.  Neurological: She is alert and oriented to person, place, and time. She has normal reflexes. No cranial nerve deficit. Coordination normal.  Skin: Skin is warm and dry. No rash noted. No erythema. No pallor.  Psychiatric: She has a normal mood and affect. Judgment normal.    Results for orders placed or performed in visit on 01/28/16  Hemoglobin A1c  Result Value Ref Range   Hemoglobin A1C 8.1   TSH  Result Value Ref Range   TSH 1.80 0.41 - 5.90 uIU/mL   Complete Blood Count (Most recent): Lab Results  Component Value Date   WBC 4.5 01/13/2015   HGB 11.8 (L) 01/13/2015   HCT 35.7 (L) 01/13/2015   MCV 86.7 01/13/2015   PLT 305 01/13/2015   Chemistry (most recent): Lab Results  Component Value Date   NA 136 01/13/2015   K 4.7 01/13/2015   CL 103 01/13/2015   CO2 22 01/13/2015   BUN 20 01/13/2015   CREATININE 1.20 (H) 01/13/2015   Diabetic Labs (most recent): Lab Results  Component Value Date   HGBA1C 8.1 01/21/2016   HGBA1C 7.8 10/15/2015   HGBA1C  7.9 07/04/2015   Assessment & Plan:   1. Type 2 diabetes mellitus with vascular disease (HCC)   Her diabetes is  complicated by coronary artery disease. Patient came with persistently elevated glucose profile. -Her   repeat A1c is 8.1% largely unchanged from her last 2 A1c as of 7.8% in 8.2%.   - Given her significant social problem, this range of A1c is acceptable for her. Patient is struggling to follow monitoring and insulin administration instructions.  She has significant  social problem, she forgets to eat on time and to monitor on time. Ideally, she needs assistance with her diabetes by a skilled personnel either at home or at group home. However she is against placement at this point. -.  Recent labs reviewed. - Patient remains at a high risk for more acute and chronic complications of diabetes which include CAD, CVA, CKD, retinopathy, and neuropathy. These are all discussed in detail with the patient.  - I have re-counseled the patient on diet management and weight loss  by adopting a carbohydrate restricted / protein rich  Diet. - Patient is advised to stick to a routine mealtimes to eat 3 meals  a day and avoid unnecessary snacks ( to snack only to correct hypoglycemia).  - Suggestion is made for patient to avoid simple carbohydrates   from their diet including Cakes , Desserts, Ice Cream,  Soda (  diet and regular) , Sweet Tea , Candies,  Chips, Cookies, Artificial Sweeteners,   and "Sugar-free" Products .  This will help patient to have stable blood glucose profile and potentially avoid unintended  Weight gain.  - The patient  will be  scheduled with Norm Salt, RDN, CDE for individualized DM education. - I have approached patient with the following individualized plan to manage diabetes and patient agrees.  -I advised her to not take any insulin due to safety concerns. -I will continue  Invokana 100 mg po qam, side effects and precautions discussed with her, and Januvia  100 mg by mouth by mouth every morning.  -Patient is encouraged to call clinic for blood glucose levels less than 70 or above 300 mg /dl.  -She does not tolerate metformin. - She does not want to go to a nursing home at this time. - Patient specific target  for A1c; LDL, HDL, Triglycerides, and  Waist Circumference were discussed in detail.  2) BP/HTN:  uncontrolled. Continue current medications including ARB.  3) Lipids/HPL: continue statins. 4)  Weight/Diet: CDE consult in progress, exercise, and carbohydrates information provided.  5) Chronic Care/Health Maintenance:  -Patient is ARB and Statin medications and encouraged to continue to follow up with Ophthalmology, Podiatrist at least yearly or according to recommendations, and advised to  stay away from smoking. I have recommended yearly flu vaccine and pneumonia vaccination at least every 5 years;  and  sleep for at least 7 hours a day.  I advised patient to maintain close follow up with their PCP for primary care needs.  Patient is asked to bring meter and  blood glucose logs during their next visit.   Follow up plan: Return in about 3 months (around 04/29/2016) for meter, and logs, follow up with pre-visit labs.  Marquis Lunch, MD Phone: [phone]  Fax: [phone]   01/28/2016, 10:40 AM

## 2016-02-11 ENCOUNTER — Other Ambulatory Visit: Payer: Self-pay

## 2016-02-11 MED ORDER — CANAGLIFLOZIN 100 MG PO TABS: 100.0000 mg | ORAL_TABLET | Freq: Every day | ORAL | 2 refills | Status: DC

## 2016-04-01 ENCOUNTER — Other Ambulatory Visit: Payer: Self-pay

## 2016-04-04 MED ORDER — DICYCLOMINE HCL 10 MG PO CAPS: ORAL_CAPSULE | ORAL | 5 refills | Status: DC

## 2016-04-12 ENCOUNTER — Other Ambulatory Visit: Payer: Self-pay

## 2016-04-12 MED ORDER — LEVOTHYROXINE SODIUM 88 MCG PO TABS: 88.0000 ug | ORAL_TABLET | Freq: Every day | ORAL | 2 refills | Status: DC

## 2016-04-12 MED ORDER — CANAGLIFLOZIN 100 MG PO TABS: 100.0000 mg | ORAL_TABLET | Freq: Every day | ORAL | 2 refills | Status: DC

## 2016-04-19 ENCOUNTER — Other Ambulatory Visit: Payer: Self-pay

## 2016-04-19 MED ORDER — LEVOTHYROXINE SODIUM 88 MCG PO TABS: 88.0000 ug | ORAL_TABLET | Freq: Every day | ORAL | 0 refills | Status: DC

## 2016-04-27 LAB — HEMOGLOBIN A1C: HEMOGLOBIN A1C: 9.1

## 2016-05-05 ENCOUNTER — Encounter: Payer: Medicare Other | Attending: "Endocrinology | Admitting: Nutrition

## 2016-05-05 ENCOUNTER — Encounter: Payer: Self-pay | Admitting: Nutrition

## 2016-05-05 ENCOUNTER — Encounter: Payer: Self-pay | Admitting: "Endocrinology

## 2016-05-05 ENCOUNTER — Ambulatory Visit (INDEPENDENT_AMBULATORY_CARE_PROVIDER_SITE_OTHER): Payer: Medicare Other | Admitting: "Endocrinology

## 2016-05-05 VITALS — BP 108/73 | HR 79 | Ht 64.0 in | Wt 220.0 lb

## 2016-05-05 VITALS — Ht 64.0 in | Wt 220.0 lb

## 2016-05-05 DIAGNOSIS — E1159 Type 2 diabetes mellitus with other circulatory complications: Secondary | ICD-10-CM | POA: Diagnosis not present

## 2016-05-05 DIAGNOSIS — E1165 Type 2 diabetes mellitus with hyperglycemia: Secondary | ICD-10-CM

## 2016-05-05 DIAGNOSIS — E038 Other specified hypothyroidism: Secondary | ICD-10-CM

## 2016-05-05 DIAGNOSIS — I10 Essential (primary) hypertension: Secondary | ICD-10-CM | POA: Diagnosis not present

## 2016-05-05 DIAGNOSIS — Z713 Dietary counseling and surveillance: Secondary | ICD-10-CM | POA: Diagnosis not present

## 2016-05-05 DIAGNOSIS — E118 Type 2 diabetes mellitus with unspecified complications: Secondary | ICD-10-CM

## 2016-05-05 DIAGNOSIS — IMO0002 Reserved for concepts with insufficient information to code with codable children: Secondary | ICD-10-CM

## 2016-05-05 MED ORDER — LEVOTHYROXINE SODIUM 112 MCG PO TABS: 112.0000 ug | ORAL_TABLET | Freq: Every day | ORAL | 6 refills | Status: DC

## 2016-05-05 NOTE — Progress Notes (Signed)
   Medical Nutrition Therapy:  Appt start time: 1130 end time:  1200.  Assessment:  Primary concerns today: Diabetes Type 2 DM. Here with her aide. A1C up to 9.1 up from 8.1%. She has been eating sugared cereals and putting sugar in foods and eating sweets according to her aide. Says she drinks a lot of water.  Limited mobility. Walks with a seated walker. Creatine 1.30 mg/dl   Meds  Januvia 409100 mg and  And Invokana 100 mg .    Needs better compliance with diet. Complains of diarrhea a lot. Physical and mental limitations     A1C 9.1%.  Lost 7 lbs. Maybe dehydrated. Needs more water intake.  Wt Readings from Last 3 Encounters:  05/05/16 220 lb (99.8 kg)  01/28/16 227 lb (103 kg)  01/28/16 227 lb 6 oz (103.1 kg)   Ht Readings from Last 3 Encounters:  05/05/16 5\' 4"  (1.626 m)  01/28/16 5\' 4"  (1.626 m)  12/17/15 5\' 4"  (1.626 m)   There is no height or weight on file to calculate BMI.  Preferred Learning Style:   No preference indicated   Learning Readiness:   Ready  Change in progress  MEDICATIONS: See List   DIETARY INTAKE:  Diet is inconsistent with meals and times and quantities of food.   Estimated energy needs: 1200 calories 135 g carbohydrates 90 g protein 33 g fat  Progress Towards Goal(s):  In progress.   Nutritional Diagnosis:  NB-1.1 Food and nutrition-related knowledge deficit As related to Diabetes.  As evidenced by A1C 8.2%..   Intervention: Nutrition and Diabetes education provided on My Plate, CHO counting, meal planning, portion sizes, timing of meals, avoiding snacks between meals unless having a low blood sugar, target ranges for A1C and blood sugars, signs/symptoms and treatment of hyper/hypoglycemia, monitoring blood sugars, taking medications as prescribed, benefits of exercising 30 minutes per day and prevention of complications of DM.   Goals Need to comply with your diet Drink more water 5-6 bottles per day.  1 Cut out frosted flakes and eat  Cherrios instead 2. Increase water intake to prevent dehydration 3. Increase fresh fruits and low carb vegetables. Take medications as prescribed. Cut out sweets  Teaching Method Utilized: Visual Auditory Hands on  Handouts given during visit include:  The Plate Method  Meal Plan Card  Diabetes Instructions.  Barriers to learning/adherence to lifestyle change: Age, inability to remember to take meds on time. Unable to prepare own meals.  Demonstrated degree of understanding via:  Teach Back   Monitoring/Evaluation:  Dietary intake, exercise, meal planning, SBG, and body weight in 3 month(s).   Would benefit from rest home or skilled care facility.

## 2016-05-05 NOTE — Patient Instructions (Addendum)
Goals Need to comply with your diet Drink more water 5-6 bottles per day.  1 Cut out frosted flakes and eat Cherrios instead 2. Increase water intake to prevent dehydration 3. Increase fresh fruits and low carb vegetables. Take medications as prescribed. Cut out sweets

## 2016-05-05 NOTE — Progress Notes (Signed)
Subjective:    Patient ID: Karen Mueller, female    DOB: [birth year],    Past Medical History:  Diagnosis Date  . Arthritis   . Asthma   . Chronic back pain   . Depression with anxiety   . Diabetes (HCC)   . GERD (gastroesophageal reflux disease)   . Heart disease   . HIV (human immunodeficiency virus infection) (HCC)   . Hypercholesterolemia   . Hypertension   . Hypothyroidism   . Schizophrenia (HCC)   . Sleep apnea   . Spinal stenosis    Past Surgical History:  Procedure Laterality Date  . BIOPSY N/A 01/16/2015   Procedure: BIOPSY;  Surgeon: Corbin Adeobert M Rourk, MD;  Location: [facility];  Service: Endoscopy;  Laterality: N/A;  Gastric, Ascending, Descending/Sigmoid  . COLONOSCOPY WITH PROPOFOL N/A 01/16/2015   RMR: internal hemorrhoids/left-side diverticulosis, segmental biopsies negative  . CORONARY ANGIOPLASTY WITH STENT PLACEMENT     X 2  . ESOPHAGOGASTRODUODENOSCOPY (EGD) WITH PROPOFOL N/A 01/16/2015   RUE:[id number]:probale cervical esophageal with s/p dilation/HH. gastric erosions, benign and no h.pylori  . HERNIA REPAIR     umbilical  . MALONEY DILATION N/A 01/16/2015   Procedure: Elease HashimotoMALONEY DILATION;  Surgeon: Corbin Adeobert M Rourk, MD;  Location: [facility];  Service: Endoscopy;  Laterality: N/A;  56/  . TUBAL LIGATION     Social History   Social History  . Marital status: Single    Spouse name: N/A  . Number of children: N/A  . Years of education: N/A   Social History Main Topics  . Smoking status: Never Smoker  . Smokeless tobacco: Never Used     Comment: Never smoked  . Alcohol use No  . Drug use: No  . Sexual activity: No   Other Topics Concern  . None   Social History Narrative  . None   Outpatient Encounter Prescriptions as of 05/05/2016  Medication Sig  . albuterol (PROVENTIL HFA;VENTOLIN HFA) 108 (90 BASE) MCG/ACT inhaler Inhale 2 puffs into the lungs every 4 (four) hours as needed for wheezing or shortness of breath.  Marland Kitchen. albuterol (PROVENTIL) (2.5 MG/3ML) 0.083%  nebulizer solution Take 2.5 mg by nebulization every 6 (six) hours as needed for wheezing or shortness of breath.  Marland Kitchen. amLODipine (NORVASC) 10 MG tablet Take 10 mg by mouth daily.  Marland Kitchen. aspirin 81 MG tablet Take 81 mg by mouth daily.  . benztropine (COGENTIN) 1 MG tablet 1 mg  . canagliflozin (INVOKANA) 100 MG TABS tablet Take 1 tablet (100 mg total) by mouth daily before breakfast.  . clopidogrel (PLAVIX) 75 MG tablet Take 75 mg by mouth daily.  . cyanocobalamin (,VITAMIN B-12,) 1000 MCG/ML injection Inject 1,000 mcg into the muscle once.  Marland Kitchen. dexlansoprazole (DEXILANT) 60 MG capsule Take by mouth.  . dicyclomine (BENTYL) 10 MG capsule TAKE 1 CAPSULE(10 MG) BY MOUTH FOUR TIMES DAILY BEFORE MEALS AND AT BEDTIME as needed for cramps/diarrhea  . emtricitabine-tenofovir (TRUVADA) 200-300 MG per tablet Take 1 tablet by mouth daily.  . ergocalciferol (VITAMIN D2) 50000 UNITS capsule Take 50,000 Units by mouth once a week.  . gabapentin (NEURONTIN) 600 MG tablet Take 600 mg by mouth at bedtime.  . hydrOXYzine (VISTARIL) 50 MG capsule 3 (three) times daily before meals. 50 mg  . Lancets (ONETOUCH ULTRASOFT) lancets Use as instructed 4 x daily. E11.65  . levothyroxine (SYNTHROID, LEVOTHROID) 112 MCG tablet Take 1 tablet (112 mcg total) by mouth daily before breakfast.  . lubiprostone (AMITIZA) 24 MCG capsule Take  1 capsule (24 mcg total) by mouth 2 (two) times daily with a meal.  . metoprolol tartrate (LOPRESSOR) 25 MG tablet Take by mouth 2 (two) times daily.   . mirabegron ER (MYRBETRIQ) 50 MG TB24 tablet Take 50 mg by mouth daily.  . montelukast (SINGULAIR) 10 MG tablet Take 10 mg by mouth at bedtime.  . Multiple Vitamins-Minerals (CERTA PLUS) TABS Take 1 tablet by mouth daily.  . potassium chloride (K-DUR,KLOR-CON) 10 MEQ tablet Take 10 mEq by mouth 2 (two) times daily.  . pravastatin (PRAVACHOL) 40 MG tablet Take 40 mg by mouth daily.  . Probiotic Product (ALIGN) 4 MG CAPS Take 1 capsule (4 mg total) by  mouth daily.  . QUEtiapine (SEROQUEL) 300 MG tablet Take 600 mg by mouth at bedtime.  . ramelteon (ROZEREM) 8 MG tablet Take by mouth.  . sertraline (ZOLOFT) 25 MG tablet 50 mg daily.   . sitaGLIPtin (JANUVIA) 50 MG tablet Take 100 mg by mouth daily.   . SUSTIVA 600 MG tablet   . tiZANidine (ZANAFLEX) 4 MG tablet Take 4 mg by mouth daily.   . valsartan (DIOVAN) 320 MG tablet Take 320 mg by mouth daily.  . [DISCONTINUED] levothyroxine (SYNTHROID, LEVOTHROID) 88 MCG tablet Take 1 tablet (88 mcg total) by mouth daily before breakfast.   No facility-administered encounter medications on file as of 05/05/2016.    ALLERGIES: Allergies  Allergen Reactions  . Metronidazole Other (See Comments)    And itching  . Sulfa Antibiotics Other (See Comments)    And itching  . Benadryl [Diphenhydramine Hcl] Hives  . Other Other (See Comments)    Other Reaction: Allergy; cauliflower   VACCINATION STATUS:  There is no immunization history on file for this patient.  Diabetes  She presents for her follow-up diabetic visit. She has type 2 diabetes mellitus. Her disease course has been worsening (Since last visit, she fell and broke her right leg.). Pertinent negatives for hypoglycemia include no confusion, headaches, pallor or seizures. Associated symptoms include fatigue, polydipsia and polyuria. Pertinent negatives for diabetes include no chest pain and no polyphagia. Symptoms are worsening. Diabetic complications include peripheral neuropathy and PVD. Risk factors for coronary artery disease include diabetes mellitus, dyslipidemia, hypertension, obesity and sedentary lifestyle. Current diabetic treatment includes insulin injections and oral agent (monotherapy) (She is unreliable to use insulin. She reports that she injects insulin at random. She did not monitor blood glucose in the last 3 weeks at least. She did not bring her meter nor logs to review.). She is compliant with treatment some of the time. Her  weight is decreasing steadily. She is following a generally unhealthy diet. When asked about meal planning, she reported none. She has had a previous visit with a dietitian. She never participates in exercise. There is no change (She monitored only  rarely and randomly,  her A1c is higher at 9.1%.) in her home blood glucose trend. Her overall blood glucose range is >200 mg/dl. (   ) An ACE inhibitor/angiotensin II receptor blocker is being taken.  Hyperlipidemia  This is a chronic problem. The current episode started more than 1 year ago. Exacerbating diseases include diabetes and obesity. Pertinent negatives include no chest pain, myalgias or shortness of breath. Risk factors for coronary artery disease include dyslipidemia, diabetes mellitus and hypertension.  Hypertension  This is a chronic problem. The current episode started more than 1 year ago. Pertinent negatives include no chest pain, headaches, palpitations or shortness of breath. Risk factors for coronary  artery disease include dyslipidemia, diabetes mellitus and sedentary lifestyle. Past treatments include angiotensin blockers. Hypertensive end-organ damage includes CAD/MI and PVD.     Review of Systems  Constitutional: Positive for fatigue. Negative for unexpected weight change.  HENT: Negative for trouble swallowing and voice change.   Eyes: Negative for visual disturbance.  Respiratory: Negative for cough, shortness of breath and wheezing.   Cardiovascular: Negative for chest pain, palpitations and leg swelling.  Gastrointestinal: Negative for diarrhea, nausea and vomiting.  Endocrine: Positive for polydipsia and polyuria. Negative for cold intolerance, heat intolerance and polyphagia.  Musculoskeletal: Positive for gait problem. Negative for arthralgias and myalgias.       She is using a walker to get around.  Skin: Negative for color change, pallor, rash and wound.  Neurological: Negative for seizures and headaches.   Psychiatric/Behavioral: Negative for confusion and suicidal ideas.    Objective:    BP 108/73   Pulse 79   Ht 5\' 4"  (1.626 m)   Wt 220 lb (99.8 kg)   BMI 37.76 kg/m   Wt Readings from Last 3 Encounters:  05/05/16 220 lb (99.8 kg)  05/05/16 220 lb (99.8 kg)  01/28/16 227 lb (103 kg)    Physical Exam  Constitutional: She is oriented to person, place, and time. She appears well-developed.  HENT:  Head: Normocephalic and atraumatic.  Eyes: EOM are normal.  Neck: Normal range of motion. Neck supple. No tracheal deviation present. No thyromegaly present.  Cardiovascular: Normal rate and regular rhythm.   Pulmonary/Chest: Effort normal and breath sounds normal.  Abdominal: Soft. Bowel sounds are normal. There is no tenderness. There is no guarding.  Musculoskeletal: She exhibits no edema.  She is using a walker to get around. She is out of the wheelchair that she used during last visit.  Neurological: She is alert and oriented to person, place, and time. She has normal reflexes. No cranial nerve deficit. Coordination normal.  Skin: Skin is warm and dry. No rash noted. No erythema. No pallor.  Psychiatric: She has a normal mood and affect. Judgment normal.    Results for orders placed or performed in visit on 05/05/16  Hemoglobin A1c  Result Value Ref Range   Hemoglobin A1C 9.1    Complete Blood Count (Most recent): Lab Results  Component Value Date   WBC 4.5 01/13/2015   HGB 11.8 (L) 01/13/2015   HCT 35.7 (L) 01/13/2015   MCV 86.7 01/13/2015   PLT 305 01/13/2015   Chemistry (most recent): Lab Results  Component Value Date   NA 136 01/13/2015   K 4.7 01/13/2015   CL 103 01/13/2015   CO2 22 01/13/2015   BUN 20 01/13/2015   CREATININE 1.20 (H) 01/13/2015   Diabetic Labs (most recent): Lab Results  Component Value Date   HGBA1C 9.1 04/27/2016   HGBA1C 8.1 01/21/2016   HGBA1C 7.8 10/15/2015   Assessment & Plan:   1. Type 2 diabetes mellitus with vascular disease  (HCC)   Her diabetes is  complicated by coronary artery disease. Patient came with  rare a random blood glucose monitoring showing elevated glucose profile. -Her   repeat A1c is  worsening at 9.1% from 8.1%.   -  Recent labs reviewed. - Patient remains at a high risk for more acute and chronic complications of diabetes which include CAD, CVA, CKD, retinopathy, and neuropathy. These are all discussed in detail with the patient.  - I have re-counseled the patient on diet management and weight loss  by adopting a carbohydrate restricted / protein rich  Diet. - Patient is advised to stick to a routine mealtimes to eat 3 meals  a day and avoid unnecessary snacks ( to snack only to correct hypoglycemia).  - Suggestion is made for patient to avoid simple carbohydrates   from their diet including Cakes , Desserts, Ice Cream,  Soda (  diet and regular) , Sweet Tea , Candies,  Chips, Cookies, Artificial Sweeteners,   and "Sugar-free" Products .  This will help patient to have stable blood glucose profile and potentially avoid unintended  Weight gain.  - The patient  will be  scheduled with Norm Salt, RDN, CDE for individualized DM education. - I have approached patient with the following individualized plan to manage diabetes and patient agrees.  - Given her significant social problem. Patient is struggling to follow monitoring and was overwhelmed with insulin administration instructions. - She is not reliable for safe use of insulin.  Ideally, she needs assistance with her diabetes by a skilled personnel either at home or at group home. However she is against placement at this point. - In the meantime, I will continue  Invokana 100 mg po qam, side effects and precautions discussed with her, and Januvia 100 mg by mouth by mouth every morning. -   -Patient is encouraged to call clinic for blood glucose levels less than 70 or above 300 mg /dl.  -She does not tolerate metformin, she has CK D  GFR  of 45 , she would not tolerate a larger dose of Invokana. - She does not want to go to a nursing home at this time. - Patient specific target  for A1c; LDL, HDL, Triglycerides, and  Waist Circumference were discussed in detail.  2) BP/HTN:  uncontrolled. Continue current medications including ARB.  3) Lipids/HPL: continue statins. 4)  Weight/Diet: CDE consult in progress, exercise, and carbohydrates information provided.  5) hypothyroidism: She would benefit from slight increase in her thyroid hormone. I would increase levothyroxine to 112 g by mouth every morning.  - We discussed about correct intake of levothyroxine, at fasting, with water, separated by at least 30 minutes from breakfast, and separated by more than 4 hours from calcium, iron, multivitamins, acid reflux medications (PPIs). -Patient is made aware of the fact that thyroid hormone replacement is needed for life, dose to be adjusted by periodic monitoring of thyroid function tests.  6) Chronic Care/Health Maintenance:  -Patient is ARB and Statin medications and encouraged to continue to follow up with Ophthalmology, Podiatrist at least yearly or according to recommendations, and advised to  stay away from smoking. I have recommended yearly flu vaccine and pneumonia vaccination at least every 5 years;  and  sleep for at least 7 hours a day.  I advised patient to maintain close follow up with their PCP for primary care needs.  Patient is asked to bring meter and  blood glucose logs during their next visit.   Follow up plan: No Follow-up on file.  Marquis Lunch, MD Phone: [phone]  Fax: [phone]   05/05/2016, 11:38 AM

## 2016-05-10 ENCOUNTER — Other Ambulatory Visit: Payer: Self-pay

## 2016-05-12 MED ORDER — LUBIPROSTONE 24 MCG PO CAPS: 24.0000 ug | ORAL_CAPSULE | Freq: Two times a day (BID) | ORAL | 11 refills | Status: DC

## 2016-05-17 ENCOUNTER — Telehealth: Payer: Self-pay

## 2016-05-17 ENCOUNTER — Ambulatory Visit (INDEPENDENT_AMBULATORY_CARE_PROVIDER_SITE_OTHER): Payer: Medicare Other | Admitting: Gastroenterology

## 2016-05-17 ENCOUNTER — Encounter: Payer: Self-pay | Admitting: Gastroenterology

## 2016-05-17 VITALS — BP 126/71 | HR 77 | Temp 97.6°F | Ht 64.0 in | Wt 217.4 lb

## 2016-05-17 DIAGNOSIS — R112 Nausea with vomiting, unspecified: Secondary | ICD-10-CM | POA: Diagnosis not present

## 2016-05-17 DIAGNOSIS — R198 Other specified symptoms and signs involving the digestive system and abdomen: Secondary | ICD-10-CM | POA: Diagnosis not present

## 2016-05-17 MED ORDER — ONDANSETRON HCL 4 MG PO TABS: 4.0000 mg | ORAL_TABLET | Freq: Three times a day (TID) | ORAL | 1 refills | Status: DC | PRN

## 2016-05-17 NOTE — Progress Notes (Signed)
Primary Care Physician: Aggie CosierBALAJI DESAI, MD  Primary Gastroenterologist:  Roetta SessionsMichael Rourk, MD   Chief Complaint  Patient presents with  . Diarrhea    x 2 months, sometimes worse after eating and with stress, taking Vancomycin x 2wks-not helped  . Abdominal Pain    across mid abd, feels hard across mid abd  . Emesis  . Weight Loss    lost approx 10 lbs in past 2 months    HPI: Karen Mueller is a 70 y.o. female hereFor follow-up. She has a history of HIV, IBS, intermittent vomiting, GERD, dysphagia.EGD in July 2016 Dr. Jena Gaussourk. 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 No H. pylori. Colonoscopy same day showed internal hemorrhoids, left-sided diverticulosis, random colon biopsies were negative for microscopic colitis.  Patient previously did not tolerate Viberzi due to nausea and vomiting. Bentyl helped with cramps. Stopping metformin corrected diarrhea in the past. When I saw her last, she was having constipation. She was started on Amitiza. Linzess had caused diarrhea in the past.   Patient presents now with complaints of 20 pound weight loss since 09/2015. Couple of months of diarrhea. BM 4-5 per day. No solid stools on consist basis. No melena, brbpr. abd cramping before BM and then subsides after. Complains of ongoing intermittent vomiting. No heartburn. [facility] [place] [facility] [place]. Patient was started on vancomcin, given 10 day supply on 05/03/16 and multiple pills still left. Patient states she has been taken prn diarrhea, not daily, like an antidiarrheal.   Diarrhea worse with stress or get in a rush.   Patient brought her medications today. They are prepackages. She has linzess 145mcg daily.    Current Outpatient Prescriptions  Medication Sig Dispense Refill  . albuterol (PROVENTIL HFA;VENTOLIN HFA) 108 (90 BASE) MCG/ACT inhaler Inhale 2 puffs into the lungs every 4 (four) hours as needed for  wheezing or shortness of breath.    Marland Kitchen. albuterol (PROVENTIL) (2.5 MG/3ML) 0.083% nebulizer solution Take 2.5 mg by nebulization every 6 (six) hours as needed for wheezing or shortness of breath.    Marland Kitchen. amLODipine (NORVASC) 10 MG tablet Take 10 mg by mouth daily.    Marland Kitchen. aspirin 81 MG tablet Take 81 mg by mouth daily.    . benztropine (COGENTIN) 1 MG tablet 1 mg    . canagliflozin (INVOKANA) 100 MG TABS tablet Take 1 tablet (100 mg total) by mouth daily before breakfast. 30 tablet 2  . clopidogrel (PLAVIX) 75 MG tablet Take 75 mg by mouth daily.    . cyanocobalamin (,VITAMIN B-12,) 1000 MCG/ML injection Inject 1,000 mcg into the muscle once.    Marland Kitchen. dexlansoprazole (DEXILANT) 60 MG capsule Take by mouth.    . dicyclomine (BENTYL) 10 MG capsule TAKE 1 CAPSULE(10 MG) BY MOUTH FOUR TIMES DAILY BEFORE MEALS AND AT BEDTIME as needed for cramps/diarrhea 120 capsule 5  . emtricitabine-tenofovir (TRUVADA) 200-300 MG per tablet Take 1 tablet by mouth daily.    . ergocalciferol (VITAMIN D2) 50000 UNITS capsule Take 50,000 Units by mouth once a week.    . gabapentin (NEURONTIN) 600 MG tablet Take 600 mg by mouth at bedtime.    . hydrOXYzine (VISTARIL) 50 MG capsule 3 (three) times daily before meals. 50 mg    . Lancets (ONETOUCH ULTRASOFT) lancets Use as instructed 4 x daily. E11.65 150 each 5  . levothyroxine (SYNTHROID, LEVOTHROID) 112 MCG tablet Take 1 tablet (112 mcg total) by mouth daily before  breakfast. 30 tablet 6  . metoprolol tartrate (LOPRESSOR) 25 MG tablet Take by mouth 2 (two) times daily.     . montelukast (SINGULAIR) 10 MG tablet Take 10 mg by mouth at bedtime.    . Multiple Vitamins-Minerals (CERTA PLUS) TABS Take 1 tablet by mouth daily.    . potassium chloride (K-DUR,KLOR-CON) 10 MEQ tablet Take 10 mEq by mouth 2 (two) times daily.    . pravastatin (PRAVACHOL) 40 MG tablet Take 40 mg by mouth daily.    . QUEtiapine (SEROQUEL) 300 MG tablet Take 600 mg by mouth at bedtime.    . sertraline (ZOLOFT)  25 MG tablet 50 mg daily.     . sitaGLIPtin (JANUVIA) 50 MG tablet Take 100 mg by mouth daily.     . SUSTIVA 600 MG tablet     . tiZANidine (ZANAFLEX) 4 MG tablet Take 4 mg by mouth daily.     . valsartan (DIOVAN) 320 MG tablet Take 320 mg by mouth daily.    . vancomycin (VANCOCIN) 125 MG capsule Take 125 mg by mouth 4 (four) times daily. Has been taking x 2 wks      No current facility-administered medications for this visit.     Allergies as of 05/17/2016 - Review Complete 05/17/2016  Allergen Reaction Noted  . Metronidazole Other (See Comments) 11/01/2014  . Sulfa antibiotics Other (See Comments) 01/01/2015  . Benadryl [diphenhydramine hcl] Hives 01/13/2015  . Other Other (See Comments) 11/01/2014    ROS:  General: Negative for anorexia, weight loss, fever, chills, fatigue, weakness. ENT: Negative for hoarseness, difficulty swallowing , nasal congestion. CV: Negative for chest pain, angina, palpitations, dyspnea on exertion, peripheral edema.  Respiratory: Negative for dyspnea at rest, dyspnea on exertion, cough, sputum, wheezing.  GI: See history of present illness. GU:  Negative for dysuria, hematuria, urinary incontinence, urinary frequency, nocturnal urination.  Endo: Negative for unusual weight change.    Physical Examination:   BP 126/71   Pulse 77   Temp 97.6 F (36.4 C) (Oral)   Ht 5\' 4"  (1.626 m)   Wt 217 lb 6.4 oz (98.6 kg)   BMI 37.32 kg/m   General: Well-nourished, well-developed in no acute distress.  Eyes: No icterus. Mouth: Oropharyngeal mucosa moist and pink , no lesions erythema or exudate. Lungs: Clear to auscultation bilaterally.  Heart: Regular rate and rhythm, no murmurs rubs or gallops.  Abdomen: Bowel sounds are normal, nontender, nondistended, no hepatosplenomegaly or masses, no abdominal bruits or hernia , no rebound or guarding.   Extremities: No lower extremity edema. No clubbing or deformities. Neuro: Alert and oriented x 4   Skin: Warm  and dry, no jaundice.   Psych: Alert and cooperative, normal mood and affect.  Labs:  Lab Results  Component Value Date   CREATININE 1.20 (H) 01/13/2015   BUN 20 01/13/2015   NA 136 01/13/2015   K 4.7 01/13/2015   CL 103 01/13/2015   CO2 22 01/13/2015   No results found for: ALT, AST, GGT, ALKPHOS, BILITOT Lab Results  Component Value Date   TSH 1.80 01/21/2016   Lab Results  Component Value Date   WBC 4.5 01/13/2015   HGB 11.8 (L) 01/13/2015   HCT 35.7 (L) 01/13/2015   MCV 86.7 01/13/2015   PLT 305 01/13/2015    Imaging Studies: No results found.

## 2016-05-17 NOTE — Assessment & Plan Note (Signed)
3-4 month h/o diarrhea. Patient on Linzess, pre-packaged pill packs. We have called her pharmacy and advised that the medication has been stopped. We will have the patient come back in 2 weeks to see how she is doing. I have advised she should stop the vancomycin that was given to her for diarrhea. She has not been taking it as prescribed, only taking "as needed". If ongoing diarrhea, then plan for stool studies.

## 2016-05-17 NOTE — Patient Instructions (Addendum)
1. Stop Linzess. We will let [facility] know.  2. Zofran 4 mg every 8 hours as needed for nausea. Prescription sent to pharmacy. 3. Do not take vancomycin tablets that you have in your pocketbook. These are the tablets that were given to you by [facility]. 4. Return to the office in 2 weeks.

## 2016-05-17 NOTE — Telephone Encounter (Signed)
Pt called because she is have very bad diarrhea and she needs something called into the pharmacy. Please advise

## 2016-05-17 NOTE — Assessment & Plan Note (Signed)
Zofran as needed. If ongoing symptoms once diarrhea resolved, we would consider GES as next step.

## 2016-05-17 NOTE — Telephone Encounter (Signed)
I explained to patient during OV today, that she likely has bad diarrhea because she is taking Linzess daily. We have called pharmacy to have them stop putting Linzess into her med packets. I asked her to wait couple of days to see if diarrhea improves OFF of Linzess.   Please reiterate this information to her.

## 2016-05-17 NOTE — Telephone Encounter (Signed)
Pt is aware.  

## 2016-05-17 NOTE — Telephone Encounter (Signed)
Per Tana CoastLeslie Lewis, PA I called [facility] and [facility] and told him to D/C the Linzess for the pt.

## 2016-05-18 NOTE — Progress Notes (Signed)
CC'ED TO PCP 

## 2016-06-02 ENCOUNTER — Telehealth: Payer: Self-pay | Admitting: Internal Medicine

## 2016-06-02 ENCOUNTER — Encounter: Payer: Self-pay | Admitting: Gastroenterology

## 2016-06-02 ENCOUNTER — Ambulatory Visit (INDEPENDENT_AMBULATORY_CARE_PROVIDER_SITE_OTHER): Payer: Medicare Other | Admitting: Gastroenterology

## 2016-06-02 VITALS — BP 159/83 | HR 81 | Temp 97.7°F | Ht 64.0 in | Wt 218.4 lb

## 2016-06-02 DIAGNOSIS — K529 Noninfective gastroenteritis and colitis, unspecified: Secondary | ICD-10-CM

## 2016-06-02 DIAGNOSIS — R1030 Lower abdominal pain, unspecified: Secondary | ICD-10-CM | POA: Diagnosis not present

## 2016-06-02 MED ORDER — ONDANSETRON HCL 4 MG PO TABS: 4.0000 mg | ORAL_TABLET | Freq: Three times a day (TID) | ORAL | 1 refills | Status: DC | PRN

## 2016-06-02 MED ORDER — HYDROCORTISONE 2.5 % RE CREA: 1.0000 "application " | TOPICAL_CREAM | Freq: Two times a day (BID) | RECTAL | 1 refills | Status: DC

## 2016-06-02 NOTE — Progress Notes (Signed)
Primary Care Physician: Aggie CosierBALAJI DESAI, MD  Primary Gastroenterologist:  Roetta SessionsMichael Rourk, MD   Chief Complaint  Patient presents with  . Diarrhea    at night  . Emesis  . Nausea  . Abdominal Pain    across lower abd    HPI: Karen Mueller is a 70 y.o. female here for four week follow up for diarrhea. She has h/o HIV, IBS, intermittent vomiting, GERD, dysphagia. EGD in July 2016 Dr. Jena Gaussourk. 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 No H. pylori. Colonoscopy same day showed internal hemorrhoids, left-sided diverticulosis, random colon biopsies were negative for microscopic colitis. Previously did not tolerate Viberzi due to N/V. Bentyl helps with cramps. Stopping metformin corrected diarrhea in the past.   Patient presented last month with complaints of diarrhea and 20 pound weight loss since 09/2015. 4-5 stools daily. She still had Linzess in her pill packets so we contacted her pharmacy and had these discontinued. Diarrhea is worse with stress. Worse at night. Lost total of 22 pounds in the past 11 months. Partially intentional. Complains of rectal burning. Some brbpr. Lower abd cramps.     Current Outpatient Prescriptions  Medication Sig Dispense Refill  . albuterol (PROVENTIL HFA;VENTOLIN HFA) 108 (90 BASE) MCG/ACT inhaler Inhale 2 puffs into the lungs every 4 (four) hours as needed for wheezing or shortness of breath.    Marland Kitchen. amLODipine (NORVASC) 10 MG tablet Take 10 mg by mouth daily.    Marland Kitchen. aspirin 81 MG tablet Take 81 mg by mouth daily.    . benztropine (COGENTIN) 1 MG tablet 1 mg    . canagliflozin (INVOKANA) 100 MG TABS tablet Take 1 tablet (100 mg total) by mouth daily before breakfast. 30 tablet 2  . clopidogrel (PLAVIX) 75 MG tablet Take 75 mg by mouth daily.    . cyanocobalamin (,VITAMIN B-12,) 1000 MCG/ML injection Inject 1,000 mcg into the muscle once.    Marland Kitchen. dexlansoprazole (DEXILANT) 60 MG capsule Take by  mouth.    . dicyclomine (BENTYL) 10 MG capsule TAKE 1 CAPSULE(10 MG) BY MOUTH FOUR TIMES DAILY BEFORE MEALS AND AT BEDTIME as needed for cramps/diarrhea 120 capsule 5  . emtricitabine-tenofovir (TRUVADA) 200-300 MG per tablet Take 1 tablet by mouth daily.    . ergocalciferol (VITAMIN D2) 50000 UNITS capsule Take 50,000 Units by mouth once a week.    . gabapentin (NEURONTIN) 600 MG tablet Take 600 mg by mouth at bedtime.    . hydrOXYzine (VISTARIL) 50 MG capsule 3 (three) times daily before meals. 50 mg    . levothyroxine (SYNTHROID, LEVOTHROID) 112 MCG tablet Take 1 tablet (112 mcg total) by mouth daily before breakfast. 30 tablet 6  . metoprolol tartrate (LOPRESSOR) 25 MG tablet Take by mouth 2 (two) times daily.     . montelukast (SINGULAIR) 10 MG tablet Take 10 mg by mouth at bedtime.    . Multiple Vitamins-Minerals (CERTA PLUS) TABS Take 1 tablet by mouth daily.    . potassium chloride (K-DUR,KLOR-CON) 10 MEQ tablet Take 10 mEq by mouth 2 (two) times daily.    . pravastatin (PRAVACHOL) 40 MG tablet Take 40 mg by mouth daily.    . QUEtiapine (SEROQUEL) 300 MG tablet Take 600 mg by mouth at bedtime.    . sertraline (ZOLOFT) 25 MG tablet 50 mg daily.     . sitaGLIPtin (JANUVIA) 50 MG tablet Take 100 mg by mouth daily.     . SUSTIVA 600  MG tablet     . tiZANidine (ZANAFLEX) 4 MG tablet Take 4 mg by mouth daily.     . valsartan (DIOVAN) 320 MG tablet Take 320 mg by mouth daily.    . Lancets (ONETOUCH ULTRASOFT) lancets Use as instructed 4 x daily. E11.65 (Patient not taking: Reported on 06/02/2016) 150 each 5  . ondansetron (ZOFRAN) 4 MG tablet Take 1 tablet (4 mg total) by mouth every 8 (eight) hours as needed for nausea or vomiting. (Patient not taking: Reported on 06/02/2016) 30 tablet 1   No current facility-administered medications for this visit.     Allergies as of 06/02/2016 - Review Complete 06/02/2016  Allergen Reaction Noted  . Metronidazole Other (See Comments) 11/01/2014  . Sulfa  antibiotics Other (See Comments) 01/01/2015  . Benadryl [diphenhydramine hcl] Hives 01/13/2015  . Other Other (See Comments) 11/01/2014   Past Surgical History:  Procedure Laterality Date  . BIOPSY N/A 01/16/2015   Procedure: BIOPSY;  Surgeon: Corbin Adeobert M Rourk, MD;  Location: [facility];  Service: Endoscopy;  Laterality: N/A;  Gastric, Ascending, Descending/Sigmoid  . COLONOSCOPY WITH PROPOFOL N/A 01/16/2015   RMR: internal hemorrhoids/left-side diverticulosis, segmental biopsies negative  . CORONARY ANGIOPLASTY WITH STENT PLACEMENT     X 2  . ESOPHAGOGASTRODUODENOSCOPY (EGD) WITH PROPOFOL N/A 01/16/2015   ZOX:WRUEAVWRMR:probale cervical esophageal with s/p dilation/HH. gastric erosions, benign and no h.pylori  . HERNIA REPAIR     umbilical  . MALONEY DILATION N/A 01/16/2015   Procedure: Elease HashimotoMALONEY DILATION;  Surgeon: Corbin Adeobert M Rourk, MD;  Location: [facility];  Service: Endoscopy;  Laterality: N/A;  54/  . TUBAL LIGATION       ROS:  General: Negative for anorexia, fever, chills, fatigue, weakness.see hpi ENT: Negative for hoarseness, difficulty swallowing , nasal congestion. CV: Negative for chest pain, angina, palpitations, dyspnea on exertion, peripheral edema.  Respiratory: Negative for dyspnea at rest, dyspnea on exertion, cough, sputum, wheezing.  GI: See history of present illness. GU:  Negative for dysuria, hematuria, urinary incontinence, urinary frequency, nocturnal urination.  Endo: Negative for unusual weight change.    Physical Examination:   BP (!) 159/83   Pulse 81   Temp 97.7 F (36.5 C) (Oral)   Ht 5\' 4"  (1.626 m)   Wt 218 lb 6.4 oz (99.1 kg)   BMI 37.49 kg/m   General: Well-nourished, well-developed in no acute distress.  Eyes: No icterus. Mouth: Oropharyngeal mucosa moist and pink , no lesions erythema or exudate. Lungs: Clear to auscultation bilaterally.  Heart: Regular rate and rhythm, no murmurs rubs or gallops.  Abdomen: Bowel sounds are normal, nontender, nondistended, no  hepatosplenomegaly or masses, no abdominal bruits or hernia , no rebound or guarding.   RECTAL: no perianal rash, mushy stool present externally and cleaned away to do exam. No other abnormality noted on exam.  Extremities: No lower extremity edema. No clubbing or deformities. Neuro: Alert and oriented x 4   Skin: Warm and dry, no jaundice.   Psych: Alert and cooperative, normal mood and affect.  Labs:  Lab Results  Component Value Date   TSH 1.80 01/21/2016   Lab Results  Component Value Date   CREATININE 1.20 (H) 01/13/2015   BUN 20 01/13/2015   NA 136 01/13/2015   K 4.7 01/13/2015   CL 103 01/13/2015   CO2 22 01/13/2015   Lab Results  Component Value Date   WBC 4.5 01/13/2015   HGB 11.8 (L) 01/13/2015   HCT 35.7 (L) 01/13/2015   MCV 86.7  01/13/2015   PLT 305 01/13/2015   No results found for: ALT, AST, GGT, ALKPHOS, BILITOT  Imaging Studies: No results found.

## 2016-06-02 NOTE — Telephone Encounter (Signed)
Pt was seen today and has 2 containers for stool that will need to be dropped off at the lab. Pt said that she lives in [place] and has no transportation to bring stool samples back. The woman with her said she didn't know how long it would be before they could take it to the lab. They were asking about taking it to a lab in [place] and I told her that I would have to ask the nurse, but she wasn't available. Please advise

## 2016-06-02 NOTE — Telephone Encounter (Signed)
Yes that is fine

## 2016-06-02 NOTE — Patient Instructions (Addendum)
1. Please have your labs and stool test done. 2. RX sent for Zofran for nausea.  3. RX for cream for pain around your bottom. Apply twice per day.

## 2016-06-06 NOTE — Assessment & Plan Note (Signed)
70 y/o female presented for diarrhea persistent after stopping Linzess. Plan for stool studies to rule out infectious etiology. Colonoscopy is up to date. . Partially intentional weight loss in the beginning but not in past few months. Plan on updated labs. Based on findings, cannot rule out possible need for CT imaging.

## 2016-06-07 NOTE — Progress Notes (Signed)
CC'D TO PCP °

## 2016-06-07 NOTE — Telephone Encounter (Signed)
Pt called and left a voicemail- she has not heard from this. I tried to call her back at [phone] NA- Darl PikesSusan, please  let her know that it is ok for her to take it to the lab in [place] if she calls back.

## 2016-06-22 ENCOUNTER — Other Ambulatory Visit: Payer: Self-pay | Admitting: Gastroenterology

## 2016-06-23 NOTE — Patient Instructions (Signed)
Labs placed in LSL box.

## 2016-06-24 LAB — CBC WITH DIFFERENTIAL/PLATELET
Basophils Absolute: 0 10*3/uL (ref 0.0–0.2)
Basos: 0 %
EOS (ABSOLUTE): 0.2 10*3/uL (ref 0.0–0.4)
EOS: 4 %
HEMATOCRIT: 41.4 % (ref 34.0–46.6)
HEMOGLOBIN: 13.8 g/dL (ref 11.1–15.9)
Immature Grans (Abs): 0 10*3/uL (ref 0.0–0.1)
Immature Granulocytes: 1 %
LYMPHS ABS: 1.8 10*3/uL (ref 0.7–3.1)
Lymphs: 43 %
MCH: 28.9 pg (ref 26.6–33.0)
MCHC: 33.3 g/dL (ref 31.5–35.7)
MCV: 87 fL (ref 79–97)
MONOCYTES: 7 %
Monocytes Absolute: 0.3 10*3/uL (ref 0.1–0.9)
NEUTROS ABS: 1.9 10*3/uL (ref 1.4–7.0)
Neutrophils: 45 %
Platelets: 283 10*3/uL (ref 150–379)
RBC: 4.78 x10E6/uL (ref 3.77–5.28)
RDW: 15.7 % — ABNORMAL HIGH (ref 12.3–15.4)
WBC: 4.2 10*3/uL (ref 3.4–10.8)

## 2016-06-24 LAB — TISSUE TRANSGLUTAMINASE, IGA: Transglutaminase IgA: <2 U/mL (ref 0–3)

## 2016-06-24 LAB — IGA: IGA/IMMUNOGLOBULIN A, SERUM: 10 mg/dL — AB (ref 87–352)

## 2016-06-24 NOTE — Progress Notes (Signed)
Reviewed labs dated 06/22/2016. Only received CBC and IgA levels so far. Her CBC was unremarkable. Her IgA level was low at 10. TTG IgA level pending but will not be interpretable given the low IgA level. Please see if we can add on TTG IgG or [facility]ac panel [facility].  I am curious if patient also had her stool studies done?

## 2016-06-25 NOTE — Progress Notes (Signed)
Called [facility]- spoke with Charlene, she is sending an order to have celiac panel done and will contact us if they do not have enough blood to complete the test. No stool studies were turned in to [facility].

## 2016-06-29 NOTE — Progress Notes (Signed)
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 Needs IgG and TTg, IgG.

## 2016-06-30 ENCOUNTER — Other Ambulatory Visit: Payer: Self-pay

## 2016-06-30 MED ORDER — CANAGLIFLOZIN 100 MG PO TABS
100.0000 mg | ORAL_TABLET | Freq: Every day | ORAL | 2 refills | Status: DC
Start: 2016-06-30 — End: 2016-10-26

## 2016-07-01 LAB — CELIAC DISEASE PANEL
Endomysial IgA: NEGATIVE
IgA/Immunoglobulin A, Serum: 10 mg/dL — ABNORMAL LOW (ref 87–352)
Transglutaminase IgA: <2 U/mL (ref 0–3)

## 2016-07-01 LAB — TISSUE TRANSGLUTAMINASE, IGG: Tissue Transglut Ab: <2 U/mL (ref 0–5)

## 2016-07-01 LAB — SPECIMEN STATUS REPORT

## 2016-07-02 NOTE — Progress Notes (Signed)
TTG IgG negative as well.  What is status on stool studies. [facility].

## 2016-07-05 ENCOUNTER — Other Ambulatory Visit: Payer: Self-pay | Admitting: Gastroenterology

## 2016-07-08 ENCOUNTER — Telehealth: Payer: Self-pay

## 2016-07-08 NOTE — Telephone Encounter (Signed)
C-Diff results are back from [facility]. Put in LSL box

## 2016-07-09 ENCOUNTER — Encounter: Payer: Self-pay | Admitting: Internal Medicine

## 2016-07-09 LAB — GI PROFILE, STOOL, PCR
Adenovirus F 40/41: NOT DETECTED
Astrovirus: NOT DETECTED
C DIFFICILE TOXIN A/B: DETECTED — AB
CAMPYLOBACTER: NOT DETECTED
CRYPTOSPORIDIUM: NOT DETECTED
Cyclospora cayetanensis: NOT DETECTED
ENTEROTOXIGENIC E COLI: NOT DETECTED
Entamoeba histolytica: NOT DETECTED
Enteroaggregative E coli: NOT DETECTED
Enteropathogenic E coli: NOT DETECTED
Giardia lamblia: NOT DETECTED
NOROVIRUS GI/GII: NOT DETECTED
PLESIOMONAS SHIGELLOIDES: NOT DETECTED
ROTAVIRUS A: NOT DETECTED
SALMONELLA: NOT DETECTED
SAPOVIRUS: NOT DETECTED
SHIGELLA/ENTEROINVASIVE E COLI: NOT DETECTED
Shiga-toxin-producing E coli: NOT DETECTED
Vibrio cholerae: NOT DETECTED
Vibrio: NOT DETECTED
YERSINIA ENTEROCOLITICA: NOT DETECTED

## 2016-07-09 LAB — CLOSTRIDIUM DIFFICILE EIA: C DIFFICILE TOXINS A+ B, EIA: NEGATIVE

## 2016-07-09 MED ORDER — VANCOMYCIN HCL 125 MG PO CAPS: 125.0000 mg | ORAL_CAPSULE | Freq: Four times a day (QID) | ORAL | 0 refills | Status: AC

## 2016-07-09 NOTE — Telephone Encounter (Signed)
Reviewed labs received yesterday. GI profile is pending. [facility] did a C. difficile toxin a and B, ERA which was negative, I did order that one.  There is received a letter fax this morning the GI profile results came back, everything was negative except her C. difficile toxin A/B PCR was positive.  Recommend vancomycin 125 mg by mouth 4 times a day for 10 days. Please let the pharmacy know they can provide her with liquid if it is cheaper.  Please provide patient with C. difficile precaution instructions, hand washing with soap and water not hand sensitizer, bleach-based cleaning products to clean the bathroom, new toothbrush at the completion of her antibiotics.  If she can hold Dexilant while on vancomycin that will help treatment of Cdiff.   Please have her come back for an office visit in 2-3 weeks.

## 2016-07-09 NOTE — Progress Notes (Signed)
Paper results came back. Cdiff PCR for toxin A/B under GI profile test was positive and has been addressed. See result note.

## 2016-07-09 NOTE — Telephone Encounter (Signed)
Karen Mueller, please schedule ov.  

## 2016-07-09 NOTE — Addendum Note (Signed)
Addended by: Myra RudeLAWSON, Bransen Fassnacht H on: 07/09/2016 09:55 AM   Modules accepted: Orders

## 2016-07-09 NOTE — Telephone Encounter (Signed)
Pt is aware. Went over recommendations with her and she stated she understood. I have also mailed her information sheet on cdiff. rx has been sent to the pharmacy.

## 2016-07-09 NOTE — Telephone Encounter (Signed)
APPT MADE AND LETTER SENT  °

## 2016-08-02 ENCOUNTER — Ambulatory Visit (INDEPENDENT_AMBULATORY_CARE_PROVIDER_SITE_OTHER): Payer: PRIVATE HEALTH INSURANCE | Admitting: Gastroenterology

## 2016-08-02 ENCOUNTER — Encounter: Payer: Self-pay | Admitting: Gastroenterology

## 2016-08-02 VITALS — BP 127/80 | HR 79 | Temp 97.6°F | Ht 64.0 in | Wt 213.2 lb

## 2016-08-02 DIAGNOSIS — A0472 Enterocolitis due to Clostridium difficile, not specified as recurrent: Secondary | ICD-10-CM | POA: Diagnosis not present

## 2016-08-02 NOTE — Progress Notes (Signed)
Primary Care Physician: Marquis LunchGebre Nida, MD  Primary Gastroenterologist:  Roetta SessionsMichael Rourk, MD   Chief Complaint  Patient presents with  . C. Diff    f/u, no diarrhea    HPI: Karen Mueller is a 71 y.o. female hereFor follow-up of C. difficile. She has a history of HIV, IBS, GERD, dysphagia. Since her last office visit in December she finally collected stool studies and a came back positive for C. difficile. He was treated with vancomycin 125 mg 4 times a day. She is completing her last day of therapy. Should've been off medication already. She has not used any Clorox-based cleaning products in her bathroom and continues to use the same toothbrush. She was provided instructions previously but states she wasn't. At any rate she states her diarrhea has resolved. She is having no GI issues. She complains of hip/leg pain/back pain. History of chronic back issues. She was advised to follow-up with her PCP. Vancomycin 125 mg 4 times a day for 10 days. Patient still has 1 day of therapy left  Current Outpatient Prescriptions  Medication Sig Dispense Refill  . albuterol (PROVENTIL HFA;VENTOLIN HFA) 108 (90 BASE) MCG/ACT inhaler Inhale 2 puffs into the lungs every 4 (four) hours as needed for wheezing or shortness of breath.    Marland Kitchen. amLODipine (NORVASC) 10 MG tablet Take 10 mg by mouth daily.    Marland Kitchen. aspirin 81 MG tablet Take 81 mg by mouth daily.    . benztropine (COGENTIN) 1 MG tablet 1 mg    . canagliflozin (INVOKANA) 100 MG TABS tablet Take 1 tablet (100 mg total) by mouth daily before breakfast. 30 tablet 2  . clopidogrel (PLAVIX) 75 MG tablet Take 75 mg by mouth daily.    . cyanocobalamin (,VITAMIN B-12,) 1000 MCG/ML injection Inject 1,000 mcg into the muscle once.    Marland Kitchen. dexlansoprazole (DEXILANT) 60 MG capsule Take by mouth.    . dicyclomine (BENTYL) 10 MG capsule TAKE 1 CAPSULE(10 MG) BY MOUTH FOUR TIMES DAILY BEFORE MEALS AND AT BEDTIME as needed for cramps/diarrhea 120 capsule 5  .  emtricitabine-tenofovir (TRUVADA) 200-300 MG per tablet Take 1 tablet by mouth daily.    . ergocalciferol (VITAMIN D2) 50000 UNITS capsule Take 50,000 Units by mouth once a week.    . gabapentin (NEURONTIN) 600 MG tablet Take 600 mg by mouth at bedtime.    . hydrocortisone (ANUSOL-HC) 2.5 % rectal cream Place 1 application rectally 2 (two) times daily. 30 g 1  . hydrOXYzine (VISTARIL) 50 MG capsule 3 (three) times daily before meals. 50 mg    . Lancets (ONETOUCH ULTRASOFT) lancets Use as instructed 4 x daily. E11.65 150 each 5  . levothyroxine (SYNTHROID, LEVOTHROID) 112 MCG tablet Take 1 tablet (112 mcg total) by mouth daily before breakfast. 30 tablet 6  . metoprolol tartrate (LOPRESSOR) 25 MG tablet Take by mouth 2 (two) times daily.     . montelukast (SINGULAIR) 10 MG tablet Take 10 mg by mouth at bedtime.    . Multiple Vitamins-Minerals (CERTA PLUS) TABS Take 1 tablet by mouth daily.    . ondansetron (ZOFRAN) 4 MG tablet Take 1 tablet (4 mg total) by mouth every 8 (eight) hours as needed for nausea or vomiting. 30 tablet 1  . potassium chloride (K-DUR,KLOR-CON) 10 MEQ tablet Take 10 mEq by mouth 2 (two) times daily.    . pravastatin (PRAVACHOL) 40 MG tablet Take 40 mg by mouth daily.    . QUEtiapine (SEROQUEL) 300 MG tablet  Take 600 mg by mouth at bedtime.    . sertraline (ZOLOFT) 25 MG tablet 50 mg daily.     . sitaGLIPtin (JANUVIA) 50 MG tablet Take 100 mg by mouth daily.     . SUSTIVA 600 MG tablet     . tiZANidine (ZANAFLEX) 4 MG tablet Take 4 mg by mouth daily.     . valsartan (DIOVAN) 320 MG tablet Take 320 mg by mouth daily.     No current facility-administered medications for this visit.     Allergies as of 08/02/2016 - Review Complete 08/02/2016  Allergen Reaction Noted  . Metronidazole Other (See Comments) 11/01/2014  . Sulfa antibiotics Other (See Comments) 01/01/2015  . Benadryl [diphenhydramine hcl] Hives 01/13/2015  . Other Other (See Comments) 11/01/2014     ROS:  General: Negative for anorexia, weight loss, fever, chills, fatigue, weakness. ENT: Negative for hoarseness, difficulty swallowing , nasal congestion. CV: Negative for chest pain, angina, palpitations, dyspnea on exertion, peripheral edema.  Respiratory: Negative for dyspnea at rest, dyspnea on exertion, cough, sputum, wheezing.  GI: See history of present illness. GU:  Negative for dysuria, hematuria, urinary incontinence, urinary frequency, nocturnal urination.  Endo: Negative for unusual weight change.    Physical Examination:   BP 127/80   Pulse 79   Temp 97.6 F (36.4 C) (Oral)   Ht 5\' 4"  (1.626 m)   Wt 213 lb 3.2 oz (96.7 kg)   BMI 36.60 kg/m   General: Well-nourished, well-developed in no acute distress.  Eyes: No icterus. Mouth: Oropharyngeal mucosa moist and pink , no lesions erythema or exudate. Lungs: Clear to auscultation bilaterally.  Heart: Regular rate and rhythm, no murmurs rubs or gallops.  Abdomen: Bowel sounds are normal, nontender, nondistended, no hepatosplenomegaly or masses, no abdominal bruits or hernia , no rebound or guarding.   Extremities: No lower extremity edema. No clubbing or deformities. Neuro: Alert and oriented x 4   Skin: Warm and dry, no jaundice.   Psych: Alert and cooperative, normal mood and affect.

## 2016-08-02 NOTE — Progress Notes (Signed)
CC'ED TO PCP 

## 2016-08-02 NOTE — Patient Instructions (Signed)
1. Please follow-up with your PCP regarding back/leg pain. 2. Complete vancomycin which was given to you for infection that caused your diarrhea. 3. Please clean her bathroom with Clorox-based products. Please get a new toothbrush. 4. Call if you have recurrent diarrhea.

## 2016-08-02 NOTE — Assessment & Plan Note (Signed)
Patient has completed 9 days of therapy. She has 1 day left. It is unclear whether or not she took medication as instructed as she should've completed this by now if she started the day was written. At any rate her diarrhea has resolved. We discussed possibility of recurrence or refractory disease. She'll call me know she has a recurrent diarrhea. She will use Clorox-based cleaners to clean her bathroom. Get a new toothbrush today. She had some non-GI complaints today including back pain, leg pain. I requested she see her PCP. There were no acute issues.

## 2016-08-03 ENCOUNTER — Encounter: Payer: Self-pay | Admitting: "Endocrinology

## 2016-08-03 LAB — TSH: TSH: 1.21 u[IU]/mL (ref 0.41–5.90)

## 2016-08-03 LAB — HEMOGLOBIN A1C: HEMOGLOBIN A1C: 8.5

## 2016-08-09 ENCOUNTER — Other Ambulatory Visit: Payer: Self-pay

## 2016-08-09 ENCOUNTER — Other Ambulatory Visit: Payer: Self-pay | Admitting: "Endocrinology

## 2016-08-09 ENCOUNTER — Encounter: Payer: Self-pay | Admitting: "Endocrinology

## 2016-08-09 ENCOUNTER — Ambulatory Visit (INDEPENDENT_AMBULATORY_CARE_PROVIDER_SITE_OTHER): Payer: 59 | Admitting: "Endocrinology

## 2016-08-09 ENCOUNTER — Encounter: Payer: Medicare Other | Attending: "Endocrinology | Admitting: Nutrition

## 2016-08-09 VITALS — Wt 211.0 lb

## 2016-08-09 VITALS — BP 137/81 | HR 97 | Ht 64.0 in | Wt 211.0 lb

## 2016-08-09 DIAGNOSIS — E1159 Type 2 diabetes mellitus with other circulatory complications: Secondary | ICD-10-CM | POA: Diagnosis not present

## 2016-08-09 DIAGNOSIS — E038 Other specified hypothyroidism: Secondary | ICD-10-CM | POA: Diagnosis not present

## 2016-08-09 DIAGNOSIS — E118 Type 2 diabetes mellitus with unspecified complications: Principal | ICD-10-CM

## 2016-08-09 DIAGNOSIS — I10 Essential (primary) hypertension: Secondary | ICD-10-CM

## 2016-08-09 DIAGNOSIS — E782 Mixed hyperlipidemia: Secondary | ICD-10-CM

## 2016-08-09 DIAGNOSIS — E1165 Type 2 diabetes mellitus with hyperglycemia: Secondary | ICD-10-CM

## 2016-08-09 DIAGNOSIS — IMO0002 Reserved for concepts with insufficient information to code with codable children: Secondary | ICD-10-CM

## 2016-08-09 MED ORDER — LEVOTHYROXINE SODIUM 125 MCG PO TABS: 125.0000 ug | ORAL_TABLET | Freq: Every day | ORAL | 2 refills | Status: DC

## 2016-08-09 MED ORDER — DICYCLOMINE HCL 10 MG PO CAPS: ORAL_CAPSULE | ORAL | 2 refills | Status: DC

## 2016-08-09 NOTE — Telephone Encounter (Signed)
Pt called to say that someone stole her medication and she would like some more called in.

## 2016-08-09 NOTE — Patient Instructions (Signed)

## 2016-08-09 NOTE — Progress Notes (Signed)
   Medical Nutrition Therapy:  Appt start time: 1030 end time:  1045 Assessment:  Primary concerns today: Diabetes Type 2 DM. Here with her support aid that is with her 5 days per week A1c  8.5%, down from 9.1%.  She notes she has cut out the sweets and drinking more water. On Januvia and Invokana daily. Not testing blood sugars to help simplify her plan.Lost 2 lbs. Not taking insulin at this time for safety since she lives alone and her cognitive function is limited.   She has her pills in pill packs now. Forgot to take Sunday am pills per her pack. All other doses taken correctly.   Wt Readings from Last 3 Encounters:  08/09/16 211 lb (95.7 kg)  08/09/16 211 lb (95.7 kg)  08/02/16 213 lb 3.2 oz (96.7 kg)   Ht Readings from Last 3 Encounters:  08/09/16 5\' 4"  (1.626 m)  08/02/16 5\' 4"  (1.626 m)  06/02/16 5\' 4"  (1.626 m)   Body mass index is 36.22 kg/m.  Preferred Learning Style:   No preference indicated   Learning Readiness:   Ready  Change in progress  MEDICATIONS: See List   DIETARY INTAKE:  Eating 3 meals per day. Sometimes snacking but trying to cut them out. Drinking water  Estimated energy needs: 1200 calories 135 g carbohydrates 90 g protein 33 g fat  Progress Towards Goal(s):  In progress.   Nutritional Diagnosis:  NB-1.1 Food and nutrition-related knowledge deficit As related to Diabetes.  As evidenced by A1C 8.2%..   Intervention: Nutrition and Diabetes education provided on My Plate, CHO counting, meal planning, portion sizes, timing of meals, avoiding snacks between meals unless having a low blood sugar, target ranges for A1C and blood sugars, signs/symptoms and treatment of hyper/hypoglycemia, monitoring blood sugars, taking medications as prescribed, benefits of exercising 30 minutes per day and prevention of complications of DM.   Goals Need to comply with your diet Drink more water 5-6 bottles per day.  1 Cut out frosted flakes and eat Cherrios  instead 2. Increase water intake to prevent dehydration 3. Increase fresh fruits and low carb vegetables. Take medications as prescribed. Cut out sweets  Teaching Method Utilized: Visual Auditory Hands on  Handouts given during visit include:  The Plate Method  Meal Plan Card  Diabetes Instructions.  Barriers to learning/adherence to lifestyle change: Age, inability to remember to take meds on time. Unable to prepare own meals.  Demonstrated degree of understanding via:  Teach Back   Monitoring/Evaluation:  Dietary intake, exercise, meal planning, SBG, and body weight in 3 month(s).

## 2016-08-09 NOTE — Patient Instructions (Signed)
Goals Keep eating meals on time Avoid snacks between meals. Drink only water Avoid sweets. Lose 2 lbs per month

## 2016-08-09 NOTE — Progress Notes (Signed)
Subjective:    Patient ID: Karen Mueller, female    DOB: May 10, 1946,    Past Medical History:  Diagnosis Date  . Arthritis   . Asthma   . Chronic back pain   . Depression with anxiety   . Diabetes (HCC)   . GERD (gastroesophageal reflux disease)   . Heart disease   . HIV (human immunodeficiency virus infection) (HCC)   . Hypercholesterolemia   . Hypertension   . Hypothyroidism   . Schizophrenia (HCC)   . Sleep apnea   . Spinal stenosis    Past Surgical History:  Procedure Laterality Date  . BIOPSY N/A 01/16/2015   Procedure: BIOPSY;  Surgeon: Corbin Adeobert M Rourk, MD;  Location: [facility];  Service: Endoscopy;  Laterality: N/A;  Gastric, Ascending, Descending/Sigmoid  . COLONOSCOPY WITH PROPOFOL N/A 01/16/2015   RMR: internal hemorrhoids/left-side diverticulosis, segmental biopsies negative  . CORONARY ANGIOPLASTY WITH STENT PLACEMENT     X 2  . ESOPHAGOGASTRODUODENOSCOPY (EGD) WITH PROPOFOL N/A 01/16/2015   UUV:[id number]:probale cervical esophageal with s/p dilation/HH. gastric erosions, benign and no h.pylori  . HERNIA REPAIR     umbilical  . MALONEY DILATION N/A 01/16/2015   Procedure: Elease HashimotoMALONEY DILATION;  Surgeon: Corbin Adeobert M Rourk, MD;  Location: [facility];  Service: Endoscopy;  Laterality: N/A;  56/  . TUBAL LIGATION     Social History   Social History  . Marital status: Single    Spouse name: N/A  . Number of children: N/A  . Years of education: N/A   Social History Main Topics  . Smoking status: Never Smoker  . Smokeless tobacco: Never Used     Comment: Never smoked  . Alcohol use No  . Drug use: No  . Sexual activity: No   Other Topics Concern  . None   Social History Narrative  . None   Outpatient Encounter Prescriptions as of 08/09/2016  Medication Sig  . albuterol (PROVENTIL HFA;VENTOLIN HFA) 108 (90 BASE) MCG/ACT inhaler Inhale 2 puffs into the lungs every 4 (four) hours as needed for wheezing or shortness of breath.  Marland Kitchen. amLODipine (NORVASC) 10 MG tablet Take 10 mg by  mouth daily.  Marland Kitchen. aspirin 81 MG tablet Take 81 mg by mouth daily.  . benztropine (COGENTIN) 1 MG tablet 1 mg  . canagliflozin (INVOKANA) 100 MG TABS tablet Take 1 tablet (100 mg total) by mouth daily before breakfast.  . clopidogrel (PLAVIX) 75 MG tablet Take 75 mg by mouth daily.  . cyanocobalamin (,VITAMIN B-12,) 1000 MCG/ML injection Inject 1,000 mcg into the muscle once.  Marland Kitchen. dexlansoprazole (DEXILANT) 60 MG capsule Take by mouth.  . dicyclomine (BENTYL) 10 MG capsule TAKE 1 CAPSULE(10 MG) BY MOUTH FOUR TIMES DAILY BEFORE MEALS AND AT BEDTIME as needed for cramps/diarrhea  . emtricitabine-tenofovir (TRUVADA) 200-300 MG per tablet Take 1 tablet by mouth daily.  . ergocalciferol (VITAMIN D2) 50000 UNITS capsule Take 50,000 Units by mouth once a week.  . gabapentin (NEURONTIN) 600 MG tablet Take 600 mg by mouth at bedtime.  . hydrocortisone (ANUSOL-HC) 2.5 % rectal cream Place 1 application rectally 2 (two) times daily.  . hydrOXYzine (VISTARIL) 50 MG capsule 3 (three) times daily before meals. 50 mg  . Lancets (ONETOUCH ULTRASOFT) lancets Use as instructed 4 x daily. E11.65  . levothyroxine (SYNTHROID, LEVOTHROID) 125 MCG tablet Take 1 tablet (125 mcg total) by mouth daily before breakfast.  . metoprolol tartrate (LOPRESSOR) 25 MG tablet Take by mouth 2 (two) times daily.   .Marland Kitchen  montelukast (SINGULAIR) 10 MG tablet Take 10 mg by mouth at bedtime.  . Multiple Vitamins-Minerals (CERTA PLUS) TABS Take 1 tablet by mouth daily.  . ondansetron (ZOFRAN) 4 MG tablet Take 1 tablet (4 mg total) by mouth every 8 (eight) hours as needed for nausea or vomiting.  . potassium chloride (K-DUR,KLOR-CON) 10 MEQ tablet Take 10 mEq by mouth 2 (two) times daily.  . pravastatin (PRAVACHOL) 40 MG tablet Take 40 mg by mouth daily.  . QUEtiapine (SEROQUEL) 300 MG tablet Take 600 mg by mouth at bedtime.  . sertraline (ZOLOFT) 25 MG tablet 50 mg daily.   . sitaGLIPtin (JANUVIA) 50 MG tablet Take 100 mg by mouth daily.   .  SUSTIVA 600 MG tablet   . tiZANidine (ZANAFLEX) 4 MG tablet Take 4 mg by mouth daily.   . valsartan (DIOVAN) 320 MG tablet Take 320 mg by mouth daily.  . [DISCONTINUED] levothyroxine (SYNTHROID, LEVOTHROID) 112 MCG tablet Take 1 tablet (112 mcg total) by mouth daily before breakfast.  . [DISCONTINUED] levothyroxine (SYNTHROID, LEVOTHROID) 125 MCG tablet Take 1 tablet (125 mcg total) by mouth daily before breakfast.   No facility-administered encounter medications on file as of 08/09/2016.    ALLERGIES: Allergies  Allergen Reactions  . Metronidazole Other (See Comments)    And itching  . Sulfa Antibiotics Other (See Comments)    And itching  . Benadryl [Diphenhydramine Hcl] Hives  . Other Other (See Comments)    Other Reaction: Allergy; cauliflower   VACCINATION STATUS:  There is no immunization history on file for this patient.  Diabetes  She presents for her follow-up diabetic visit. She has type 2 diabetes mellitus. Her disease course has been stable. Pertinent negatives for hypoglycemia include no confusion, headaches, pallor or seizures. Associated symptoms include fatigue, polydipsia and polyuria. Pertinent negatives for diabetes include no chest pain and no polyphagia. Symptoms are stable. Diabetic complications include peripheral neuropathy and PVD. Risk factors for coronary artery disease include diabetes mellitus, dyslipidemia, hypertension, obesity and sedentary lifestyle. Current diabetic treatment includes insulin injections and oral agent (monotherapy) (She is unreliable to use insulin. She reports that she injects insulin at random. She did not monitor blood glucose in the last 3 weeks at least. She did not bring her meter nor logs to review.). She is compliant with treatment some of the time. Her weight is decreasing steadily. She is following a generally unhealthy diet. When asked about meal planning, she reported none. She has had a previous visit with a dietitian. She never  participates in exercise. There is no change (She did not monitor blood glucose, her A1c is 8.5% improving from  9.1%.) in her home blood glucose trend. (   ) An ACE inhibitor/angiotensin II receptor blocker is being taken.  Hyperlipidemia  This is a chronic problem. The current episode started more than 1 year ago. Exacerbating diseases include diabetes and obesity. Pertinent negatives include no chest pain, myalgias or shortness of breath. Risk factors for coronary artery disease include dyslipidemia, diabetes mellitus and hypertension.  Hypertension  This is a chronic problem. The current episode started more than 1 year ago. Pertinent negatives include no chest pain, headaches, palpitations or shortness of breath. Risk factors for coronary artery disease include dyslipidemia, diabetes mellitus and sedentary lifestyle. Past treatments include angiotensin blockers. Hypertensive end-organ damage includes CAD/MI and PVD.     Review of Systems  Constitutional: Positive for fatigue. Negative for unexpected weight change.  HENT: Negative for trouble swallowing and voice change.  Eyes: Negative for visual disturbance.  Respiratory: Negative for cough, shortness of breath and wheezing.   Cardiovascular: Negative for chest pain, palpitations and leg swelling.  Gastrointestinal: Negative for diarrhea, nausea and vomiting.  Endocrine: Positive for polydipsia and polyuria. Negative for cold intolerance, heat intolerance and polyphagia.  Musculoskeletal: Positive for gait problem. Negative for arthralgias and myalgias.       She is using a walker to get around.  Skin: Negative for color change, pallor, rash and wound.  Neurological: Negative for seizures and headaches.  Psychiatric/Behavioral: Negative for confusion and suicidal ideas.    Objective:    BP 137/81   Pulse 97   Ht 5\' 4"  (1.626 m)   Wt 211 lb (95.7 kg)   BMI 36.22 kg/m   Wt Readings from Last 3 Encounters:  08/09/16 211 lb  (95.7 kg)  08/02/16 213 lb 3.2 oz (96.7 kg)  06/02/16 218 lb 6.4 oz (99.1 kg)    Physical Exam  Constitutional: She is oriented to person, place, and time. She appears well-developed.  HENT:  Head: Normocephalic and atraumatic.  Eyes: EOM are normal.  Neck: Normal range of motion. Neck supple. No tracheal deviation present. No thyromegaly present.  Cardiovascular: Normal rate and regular rhythm.   Pulmonary/Chest: Effort normal and breath sounds normal.  Abdominal: Soft. Bowel sounds are normal. There is no tenderness. There is no guarding.  Musculoskeletal: She exhibits no edema.  She is using a walker to get around. She is out of the wheelchair that she used during last visit.  Neurological: She is alert and oriented to person, place, and time. She has normal reflexes. No cranial nerve deficit. Coordination normal.  Skin: Skin is warm and dry. No rash noted. No erythema. No pallor.  Psychiatric: She has a normal mood and affect. Judgment normal.    Results for orders placed or performed in visit on 07/05/16  Clostridium difficile EIA  Result Value Ref Range   C difficile Toxins A+B, EIA Negative Negative  GI Profile, Stool, PCR  Result Value Ref Range   Campylobacter Not Detected Not Detected   C difficile toxin A/B Detected (A) Not Detected   Plesiomonas shigelloides Not Detected Not Detected   Salmonella Not Detected Not Detected   Vibrio Not Detected Not Detected   Vibrio cholerae Not Detected Not Detected   Yersinia enterocolitica Not Detected Not Detected   Enteroaggregative E coli Not Detected Not Detected   Enteropathogenic E coli Not Detected Not Detected   Enterotoxigenic E coli Not Detected Not Detected   Shiga-toxin-producing E coli Not Detected Not Detected   E coli O157 Not applicable Not Detected   Shigella/Enteroinvasive E coli Not Detected Not Detected   Cryptosporidium Not Detected Not Detected   Cyclospora cayetanensis Not Detected Not Detected   Entamoeba  histolytica Not Detected Not Detected   Giardia lamblia Not Detected Not Detected   Adenovirus F 40/41 Not Detected Not Detected   Astrovirus Not Detected Not Detected   Norovirus GI/GII Not Detected Not Detected   Rotavirus A Not Detected Not Detected   Sapovirus Not Detected Not Detected   Complete Blood Count (Most recent): Lab Results  Component Value Date   WBC 4.2 06/22/2016   HGB 11.8 (L) 01/13/2015   HCT 41.4 06/22/2016   MCV 87 06/22/2016   PLT 283 06/22/2016   Chemistry (most recent): Lab Results  Component Value Date   NA 136 01/13/2015   K 4.7 01/13/2015   CL 103 01/13/2015  CO2 22 01/13/2015   BUN 20 01/13/2015   CREATININE 1.20 (H) 01/13/2015   Diabetic Labs (most recent): Lab Results  Component Value Date   HGBA1C 9.1 04/27/2016   HGBA1C 8.1 01/21/2016   HGBA1C 7.8 10/15/2015   Assessment & Plan:   1. Type 2 diabetes mellitus with vascular disease (HCC)   Her diabetes is  complicated by coronary artery disease. Patient came with  rare a random blood glucose monitoring showing elevated glucose profile. -Her   repeat A1c is  8.5% improving from 9.1%.   -  Recent labs reviewed. - Patient remains at a high risk for more acute and chronic complications of diabetes which include CAD, CVA, CKD, retinopathy, and neuropathy. These are all discussed in detail with the patient.  - I have re-counseled the patient on diet management and weight loss  by adopting a carbohydrate restricted / protein rich  Diet. - Patient is advised to stick to a routine mealtimes to eat 3 meals  a day and avoid unnecessary snacks ( to snack only to correct hypoglycemia).  - Suggestion is made for patient to avoid simple carbohydrates   from their diet including Cakes , Desserts, Ice Cream,  Soda (  diet and regular) , Sweet Tea , Candies,  Chips, Cookies, Artificial Sweeteners,   and "Sugar-free" Products .  This will help patient to have stable blood glucose profile and potentially  avoid unintended  Weight gain.  - The patient  will be  scheduled with Norm Salt, RDN, CDE for individualized DM education. - I have approached patient with the following individualized plan to manage diabetes and patient agrees.  - Given her significant social problem. Patient is struggling to follow monitoring and was overwhelmed with insulin administration instructions. - She is not reliable for safe use of insulin.  Ideally, she needs assistance with her diabetes by a skilled personnel either at home or at group home. However she is against placement at this point. - In the meantime, I will continue  Invokana 100 mg po qam, side effects and precautions discussed with her, and Januvia 100 mg by mouth by mouth every morning.  -Patient is encouraged to call clinic for blood glucose levels less than 70 or above 300 mg /dl.  -She does not tolerate metformin, she has CKD  GFR of 51 improving from 45 , she would not tolerate a larger dose of Invokana. - She does not want to go to a nursing home at this time. - Patient specific target  for A1c; LDL, HDL, Triglycerides, and  Waist Circumference were discussed in detail.  2) BP/HTN:  uncontrolled. Continue current medications including ARB.  3) Lipids/HPL: continue statins. 4)  Weight/Diet: CDE consult in progress, exercise, and carbohydrates information provided.  5) hypothyroidism: She would benefit from slight increase in her thyroid hormone. I would increase levothyroxine to 125 g by mouth every morning.  - We discussed about correct intake of levothyroxine, at fasting, with water, separated by at least 30 minutes from breakfast, and separated by more than 4 hours from calcium, iron, multivitamins, acid reflux medications (PPIs). -Patient is made aware of the fact that thyroid hormone replacement is needed for life, dose to be adjusted by periodic monitoring of thyroid function tests.  6) Chronic Care/Health Maintenance:  -Patient is  ARB and Statin medications and encouraged to continue to follow up with Ophthalmology, Podiatrist at least yearly or according to recommendations, and advised to  stay away from smoking. I have recommended  yearly flu vaccine and pneumonia vaccination at least every 5 years;  and  sleep for at least 7 hours a day.  I advised patient to maintain close follow up with their PCP for primary care needs.  Patient is asked to bring meter and  blood glucose logs during their next visit.   Follow up plan: Return in about 3 months (around 11/06/2016) for follow up with pre-visit labs, meter, and logs.  Marquis Lunch, MD Phone: [phone]  Fax: [phone]   08/09/2016, 11:00 AM

## 2016-10-07 ENCOUNTER — Other Ambulatory Visit: Payer: Self-pay

## 2016-10-08 MED ORDER — HYDROCORTISONE 2.5 % RE CREA: 1.0000 "application " | TOPICAL_CREAM | Freq: Two times a day (BID) | RECTAL | 0 refills | Status: DC

## 2016-10-08 MED ORDER — DOCUSATE SODIUM 100 MG PO CAPS: 100.0000 mg | ORAL_CAPSULE | Freq: Two times a day (BID) | ORAL | 5 refills | Status: DC

## 2016-10-14 ENCOUNTER — Telehealth: Payer: Self-pay | Admitting: Internal Medicine

## 2016-10-14 NOTE — Telephone Encounter (Signed)
Pt started having diarrhea a half hour ago and doesn't know what to do. Please advise and call her at [phone]  She is aware of her OV on 5/4 at 11 with [facility] and appt card was mailed.

## 2016-10-15 NOTE — Telephone Encounter (Signed)
Spoke with the pt, she said she only had diarrhea one time yesterday but it was bad and she couldn't hold it. She has not had any diarrhea since. No fever, no vomiting, no blood in stool. No recent abx. She has some nausea. Pt wants another rx for vanc. I told her I didn't know if we could send it in after only one episode of diarrhea but pt wants me to ask.

## 2016-10-15 NOTE — Telephone Encounter (Signed)
If she has diarrhea lasting for more than 48 hours then I would recommend rechecking Cdiff PCR. Only provide vancomycin with documented Cdiff in situations like this.

## 2016-10-18 NOTE — Telephone Encounter (Signed)
Tried to call pt- NA- LMOM with recommendations.  

## 2016-10-26 ENCOUNTER — Other Ambulatory Visit: Payer: Self-pay

## 2016-10-26 MED ORDER — CANAGLIFLOZIN 100 MG PO TABS: 100.0000 mg | ORAL_TABLET | Freq: Every day | ORAL | 2 refills | Status: DC

## 2016-10-27 MED ORDER — DICYCLOMINE HCL 10 MG PO CAPS: ORAL_CAPSULE | ORAL | 1 refills | Status: DC

## 2016-10-29 ENCOUNTER — Encounter: Payer: Self-pay | Admitting: Gastroenterology

## 2016-10-29 ENCOUNTER — Ambulatory Visit (INDEPENDENT_AMBULATORY_CARE_PROVIDER_SITE_OTHER): Payer: 59 | Admitting: Gastroenterology

## 2016-10-29 VITALS — BP 126/74 | HR 67 | Temp 97.1°F | Ht 65.0 in | Wt 208.2 lb

## 2016-10-29 DIAGNOSIS — R112 Nausea with vomiting, unspecified: Secondary | ICD-10-CM

## 2016-10-29 DIAGNOSIS — R634 Abnormal weight loss: Secondary | ICD-10-CM | POA: Insufficient documentation

## 2016-10-29 NOTE — Patient Instructions (Signed)
1. Please have stomach emptying test as scheduled. We will contact you with results when available.  2. The little brown pill you asked about today is prescription Imodium (an antidiarrhea medicine). Since you are having constipation I do not recommend taking any right now. The little blue capsules in your pill box, four daily, are supposed to help with abdominal cramping.

## 2016-10-29 NOTE — Assessment & Plan Note (Signed)
Persistent N/V, daily. h/o chronic DM. Abnormal weight loss. EGD/TCS 2016, see PSH. Consider delayed gastric emptying in setting of polypharmacy and chronic DM. GES in near future.   She has 30 pound weight loss in one year. Last Hgb A1c 8.5 three months ago. Followed closely by dietician.

## 2016-10-29 NOTE — Progress Notes (Signed)
Primary Care Physician: Marquis Lunch, MD  Primary Gastroenterologist:  Roetta Sessions, MD   Chief Complaint  Patient presents with  . Constipation    alternates with diarrhea    HPI: Karen Mueller is a 71 y.o. female here for follow up. Last seen in 07/2016. h/o IBS, Cdiff, GERD, dysphagia, HIV. Patient  Complains of N/V for several weeks. Symptoms occurring daily. Able to tolerate liquids. No heartburn. Takes zofran with relief. Symptoms worse at nighttime. BM every 3 days. No diarrhea. No melena, brbpr. Takes bentyl four times daily according to her pill pack. Complains of vague lower abd pain worse when she has the nausea. No fever.    She brings in a brown pill, one left in an old bottle and wants a refill. Looked up in pill database and it was loperamide. Explained to patient that she did not need this anymore.   Her weight is down 10 pounds since 05/2016 and 30 pounds in the past one year. Followed closely by nutrition.  Current Outpatient Prescriptions  Medication Sig Dispense Refill  . albuterol (PROVENTIL HFA;VENTOLIN HFA) 108 (90 BASE) MCG/ACT inhaler Inhale 2 puffs into the lungs every 4 (four) hours as needed for wheezing or shortness of breath.    Marland Kitchen amLODipine (NORVASC) 10 MG tablet Take 10 mg by mouth daily.    Marland Kitchen aspirin 81 MG tablet Take 81 mg by mouth daily.    . benztropine (COGENTIN) 1 MG tablet 1 mg    . canagliflozin (INVOKANA) 100 MG TABS tablet Take 1 tablet (100 mg total) by mouth daily before breakfast. 30 tablet 2  . clopidogrel (PLAVIX) 75 MG tablet Take 75 mg by mouth daily.    . cyanocobalamin (,VITAMIN B-12,) 1000 MCG/ML injection Inject 1,000 mcg into the muscle once.    Marland Kitchen dexlansoprazole (DEXILANT) 60 MG capsule Take by mouth.    . dicyclomine (BENTYL) 10 MG capsule TAKE 1 CAPSULE(10 MG) BY MOUTH FOUR TIMES DAILY BEFORE MEALS AND AT BEDTIME as needed for cramps/diarrhea 120 capsule 1  . docusate sodium (COLACE) 100 MG capsule Take 1 capsule (100 mg  total) by mouth 2 (two) times daily. 60 capsule 5  . emtricitabine-tenofovir (TRUVADA) 200-300 MG per tablet Take 1 tablet by mouth daily.    . ergocalciferol (VITAMIN D2) 50000 UNITS capsule Take 50,000 Units by mouth once a week.    . gabapentin (NEURONTIN) 600 MG tablet Take 600 mg by mouth at bedtime.    . hydrocortisone (ANUSOL-HC) 2.5 % rectal cream Place 1 application rectally 2 (two) times daily. 30 g 0  . hydrOXYzine (VISTARIL) 50 MG capsule 3 (three) times daily before meals. 50 mg    . Lancets (ONETOUCH ULTRASOFT) lancets Use as instructed 4 x daily. E11.65 150 each 5  . levothyroxine (SYNTHROID, LEVOTHROID) 125 MCG tablet Take 1 tablet (125 mcg total) by mouth daily before breakfast. 90 tablet 2  . metoprolol tartrate (LOPRESSOR) 25 MG tablet Take by mouth 2 (two) times daily.     . montelukast (SINGULAIR) 10 MG tablet Take 10 mg by mouth at bedtime.    . Multiple Vitamins-Minerals (CERTA PLUS) TABS Take 1 tablet by mouth daily.    . ondansetron (ZOFRAN) 4 MG tablet Take 1 tablet (4 mg total) by mouth every 8 (eight) hours as needed for nausea or vomiting. 30 tablet 1  . potassium chloride (K-DUR,KLOR-CON) 10 MEQ tablet Take 10 mEq by mouth 2 (two) times daily.    . pravastatin (PRAVACHOL) 40  MG tablet Take 40 mg by mouth daily.    . QUEtiapine (SEROQUEL) 300 MG tablet Take 600 mg by mouth at bedtime.    . sertraline (ZOLOFT) 25 MG tablet 50 mg daily.     . sitaGLIPtin (JANUVIA) 50 MG tablet Take 100 mg by mouth daily.     . SUSTIVA 600 MG tablet     . tiZANidine (ZANAFLEX) 4 MG tablet Take 4 mg by mouth daily.     . valsartan (DIOVAN) 320 MG tablet Take 320 mg by mouth daily.     No current facility-administered medications for this visit.     Allergies as of 10/29/2016 - Review Complete 08/09/2016  Allergen Reaction Noted  . Metronidazole Other (See Comments) 11/01/2014  . Sulfa antibiotics Other (See Comments) 01/01/2015  . Benadryl [diphenhydramine hcl] Hives 01/13/2015    . Other Other (See Comments) 11/01/2014    ROS:  General: Negative for anorexia,  fever, chills, fatigue, weakness. See hpi ENT: Negative for hoarseness, difficulty swallowing , nasal congestion. CV: Negative for chest pain, angina, palpitations, dyspnea on exertion, peripheral edema.  Respiratory: Negative for dyspnea at rest, dyspnea on exertion, cough, sputum, wheezing.  GI: See history of present illness. GU:  Negative for dysuria, hematuria, urinary incontinence, urinary frequency, nocturnal urination.  Endo: see hpi   Physical Examination:   There were no vitals taken for this visit.  General: Well-nourished, well-developed in no acute distress.  Eyes: No icterus. Mouth: Oropharyngeal mucosa moist and pink , no lesions erythema or exudate. Lungs: Clear to auscultation bilaterally.  Heart: Regular rate and rhythm, no murmurs rubs or gallops.  Abdomen: Bowel sounds are normal, nontender, nondistended, no hepatosplenomegaly or masses, no abdominal bruits or hernia , no rebound or guarding.   Extremities: No lower extremity edema. No clubbing or deformities. Neuro: Alert and oriented x 4   Skin: Warm and dry, no jaundice.   Psych: Alert and cooperative, normal mood and affect.  Labs:      Imaging Studies: No results found.

## 2016-11-01 NOTE — Progress Notes (Signed)
CC'ED TO PCP 

## 2016-11-04 ENCOUNTER — Encounter (HOSPITAL_COMMUNITY)
Admission: RE | Admit: 2016-11-04 | Discharge: 2016-11-04 | Disposition: A | Discharge status: Home / Self Care | Payer: Medicare Other | Source: Ambulatory Visit | Attending: Gastroenterology | Admitting: Gastroenterology

## 2016-11-04 ENCOUNTER — Encounter (HOSPITAL_COMMUNITY): Payer: Self-pay

## 2016-11-04 DIAGNOSIS — R112 Nausea with vomiting, unspecified: Secondary | ICD-10-CM | POA: Diagnosis present

## 2016-11-04 MED ORDER — TECHNETIUM TC 99M SULFUR COLLOID
2.0000 | Freq: Once | INTRAVENOUS | Status: AC | PRN
  Administered 2016-11-04: 2 via ORAL

## 2016-11-10 ENCOUNTER — Encounter: Payer: Medicaid - Out of State | Attending: "Endocrinology | Admitting: Nutrition

## 2016-11-10 ENCOUNTER — Encounter: Payer: Medicare Other | Admitting: "Endocrinology

## 2016-11-10 VITALS — Wt 210.0 lb

## 2016-11-10 DIAGNOSIS — E119 Type 2 diabetes mellitus without complications: Secondary | ICD-10-CM | POA: Diagnosis not present

## 2016-11-10 DIAGNOSIS — Z6835 Body mass index (BMI) 35.0-35.9, adult: Secondary | ICD-10-CM

## 2016-11-10 DIAGNOSIS — Z713 Dietary counseling and surveillance: Secondary | ICD-10-CM | POA: Diagnosis not present

## 2016-11-10 DIAGNOSIS — E118 Type 2 diabetes mellitus with unspecified complications: Secondary | ICD-10-CM

## 2016-11-10 NOTE — Progress Notes (Signed)
   Medical Nutrition Therapy:  Appt start time: 1115  end time: 130Assessment:  Primary concerns today: Diabetes Type 2 DM. Didn't get her lab work done. Hasn't been testing blood sugars as requested. Lost 1 lb since last visit. [facility] [place] [facility] in her knees.    Not compliant with diet as she is eating sugared cereals and processed foods. Has an aide that is suppose to help with meds, meals etc. She reports they don't help her.     She hasn't tested her BS since April 2018 and BS were in mid 200's. Says she takes her meds. Pills in bubble packs.    Tends to be forgettful  Diet is inconsistent to meet her needs.   Wt Readings from Last 3 Encounters:  11/10/16 210 lb (95.3 kg)  10/29/16 208 lb 3.2 oz (94.4 kg)  08/09/16 211 lb (95.7 kg)   Ht Readings from Last 3 Encounters:  10/29/16 5\' 5"  (1.651 m)  08/09/16 5\' 4"  (1.626 m)  08/02/16 5\' 4"  (1.626 m)   Body mass index is 34.95 kg/m.  Preferred Learning Style:   No preference indicated   Learning Readiness:   Ready  Change in progress  MEDICATIONS: See List   DIETARY INTAKE:  Breakfast:  Clydie Braunaptain Crunch with milk, Lunch:  Raveloi  Sweet Tea Dinner:  Estimated energy needs: 1200 calories 135 g carbohydrates 90 g protein 33 g fat  Progress Towards Goal(s):  In progress.   Nutritional Diagnosis:  NB-1.1 Food and nutrition-related knowledge deficit As related to Diabetes.  As evidenced by A1C 8.2%..   Intervention: Nutrition and Diabetes education provided on My Plate, CHO counting, meal planning, portion sizes, timing of meals, avoiding snacks between meals unless having a low blood sugar, target ranges for A1C and blood sugars, signs/symptoms and treatment of hyper/hypoglycemia, monitoring blood sugars, taking medications as prescribed, benefits of exercising 30 minutes per day and prevention of complications of DM.   Goals Need to comply with your diet Drink more water 5-6  bottles per day.  Goals 1Get lab work done and after it's done make appt with Dr. Fransico HimNida 2. Eat three meals per day  B) 6-8 am  L) 12-2 pm D)5-7 Test blood sugars twice a day; before breakfast and before bedtime Increase fresh fruits and vegetables Cut out sweets, soda, sweet tea, juice or candy/junk food  Teaching Method Utilized: Visual Auditory Hands on  Handouts given during visit include:  The Plate Method  Meal Plan Card  Diabetes Instructions.  Barriers to learning/adherence to lifestyle change: Age, inability to remember to take meds on time. Unable to prepare own meals.  Demonstrated degree of understanding via:  Teach Back   Monitoring/Evaluation:  Dietary intake, exercise, meal planning, SBG, and body weight in 3 month(s).  She may benefit from nursing home or assisted living to better assist her with her medications, meals and mobility needs. However, she doesn't want to go to a facility.

## 2016-11-10 NOTE — Patient Instructions (Signed)
Goals 1Get lab work done and after it's done make appt with Dr. Fransico HimNida 2. Eat three meals per day  B) 6-8 am  L) 12-2 pm D)5-7 Test blood sugars twice a day; before breakfast and before bedtime Increase fresh fruits and vegetables Cut out sweets, soda, sweet tea, juice or candy/junk food

## 2016-11-10 NOTE — Progress Notes (Signed)
Please let patient know that she has mild delay of stomach emptying which may or may not be contributing to her nausea.  I would recommend avoiding large meals, eat multiple small meals/snacks daily, avoid greasy/right foods, especially at night make sure that she does not overeat does not lay down within 3 hours of her last meal the day.  Return to see Dr. Jena Gaussourk only in 4 months.

## 2016-11-11 NOTE — Progress Notes (Signed)
This encounter was created in error - please disregard.

## 2016-11-24 ENCOUNTER — Ambulatory Visit: Payer: 59 | Admitting: Orthopaedic Surgery

## 2016-11-25 NOTE — Progress Notes (Signed)
ON RECALL  °

## 2016-11-30 ENCOUNTER — Ambulatory Visit (INDEPENDENT_AMBULATORY_CARE_PROVIDER_SITE_OTHER): Payer: 59

## 2016-11-30 ENCOUNTER — Ambulatory Visit (INDEPENDENT_AMBULATORY_CARE_PROVIDER_SITE_OTHER): Payer: 59 | Admitting: Orthopaedic Surgery

## 2016-11-30 ENCOUNTER — Encounter: Payer: Self-pay | Admitting: Orthopaedic Surgery

## 2016-11-30 VITALS — BP 111/72 | HR 64 | Temp 97.2°F | Ht 61.5 in | Wt 207.0 lb

## 2016-11-30 DIAGNOSIS — M25562 Pain in left knee: Secondary | ICD-10-CM | POA: Diagnosis not present

## 2016-11-30 DIAGNOSIS — G8929 Other chronic pain: Secondary | ICD-10-CM | POA: Diagnosis not present

## 2016-11-30 DIAGNOSIS — M25561 Pain in right knee: Secondary | ICD-10-CM

## 2016-11-30 NOTE — Progress Notes (Signed)
Subjective:    Patient ID: Karen Mueller, female    DOB: 1945-07-30, 71 y.o.   MRN: [id number]  HPI She has a long history of bilateral knee pain that is getting worse.  She lives in [place].  She uses a walker.  She says her knees are just worn out.  She was seen at [facility] [place] on May 15.  X-rays were done then she says.  She did not bring any films with her.  She says they injected both knees.  She says they told her she needed total knees.  She does not want to have surgery there but wants to be closer to home.  She has diabetes, heart disease and a bad back.  She says at [facility] they also told her she would need back surgery.  She has no trauma.  She can hardly walk with her rolling walker.  She has pain and crepitus.  She has no redness.  She has no distal edema.  She has no trauma.   Review of Systems  HENT: Negative for congestion.   Respiratory: Positive for shortness of breath. Negative for cough.   Cardiovascular: Positive for palpitations. Negative for chest pain and leg swelling.  Endocrine: Positive for cold intolerance.  Musculoskeletal: Positive for arthralgias, back pain, gait problem, joint swelling and myalgias.  Allergic/Immunologic: Positive for environmental allergies.  Psychiatric/Behavioral: The patient is nervous/anxious.    Past Medical History:  Diagnosis Date  . Arthritis   . Asthma   . Chronic back pain   . Depression with anxiety   . Diabetes (HCC)   . GERD (gastroesophageal reflux disease)   . Heart disease   . HIV (human immunodeficiency virus infection) (HCC)   . Hypercholesterolemia   . Hypertension   . Hypothyroidism   . Schizophrenia (HCC)   . Sleep apnea   . Spinal stenosis     Past Surgical History:  Procedure Laterality Date  . BIOPSY N/A 01/16/2015   Procedure: BIOPSY;  Surgeon: Corbin Ade, MD;  Location: [facility];  Service: Endoscopy;  Laterality: N/A;  Gastric, Ascending, Descending/Sigmoid  . COLONOSCOPY WITH PROPOFOL  N/A 01/16/2015   RMR: internal hemorrhoids/left-side diverticulosis, segmental biopsies negative  . CORONARY ANGIOPLASTY WITH STENT PLACEMENT     X 2  . ESOPHAGOGASTRODUODENOSCOPY (EGD) WITH PROPOFOL N/A 01/16/2015   WUJ:[id number] cervical esophageal with s/p dilation/HH. gastric erosions, benign and no h.pylori  . HERNIA REPAIR     umbilical  . MALONEY DILATION N/A 01/16/2015   Procedure: [name] DILATION;  Surgeon: Corbin Ade, MD;  Location: [facility];  Service: Endoscopy;  Laterality: N/A;  54/  . TUBAL LIGATION      Current Outpatient Prescriptions on File Prior to Visit  Medication Sig Dispense Refill  . albuterol (PROVENTIL HFA;VENTOLIN HFA) 108 (90 BASE) MCG/ACT inhaler Inhale 2 puffs into the lungs every 4 (four) hours as needed for wheezing or shortness of breath.    Marland Kitchen amLODipine (NORVASC) 10 MG tablet Take 10 mg by mouth daily.    Marland Kitchen aspirin 81 MG tablet Take 81 mg by mouth daily.    . benztropine (COGENTIN) 1 MG tablet 1 mg    . canagliflozin (INVOKANA) 100 MG TABS tablet Take 1 tablet (100 mg total) by mouth daily before breakfast. 30 tablet 2  . clopidogrel (PLAVIX) 75 MG tablet Take 75 mg by mouth daily.    . cyanocobalamin (,VITAMIN B-12,) 1000 MCG/ML injection Inject 1,000 mcg into the muscle once.    Marland Kitchen  dexlansoprazole (DEXILANT) 60 MG capsule Take by mouth.    . dicyclomine (BENTYL) 10 MG capsule TAKE 1 CAPSULE(10 MG) BY MOUTH FOUR TIMES DAILY BEFORE MEALS AND AT BEDTIME as needed for cramps/diarrhea 120 capsule 1  . docusate sodium (COLACE) 100 MG capsule Take 1 capsule (100 mg total) by mouth 2 (two) times daily. 60 capsule 5  . emtricitabine-tenofovir (TRUVADA) 200-300 MG per tablet Take 1 tablet by mouth daily.    . ergocalciferol (VITAMIN D2) 50000 UNITS capsule Take 50,000 Units by mouth once a week.    . gabapentin (NEURONTIN) 600 MG tablet Take 600 mg by mouth at bedtime.    . hydrocortisone (ANUSOL-HC) 2.5 % rectal cream Place 1 application rectally 2 (two) times  daily. 30 g 0  . hydrOXYzine (VISTARIL) 50 MG capsule 3 (three) times daily before meals. 50 mg    . Lancets (ONETOUCH ULTRASOFT) lancets Use as instructed 4 x daily. E11.65 150 each 5  . levothyroxine (SYNTHROID, LEVOTHROID) 125 MCG tablet Take 1 tablet (125 mcg total) by mouth daily before breakfast. 90 tablet 2  . lubiprostone (AMITIZA) 24 MCG capsule Take 24 mcg by mouth 2 (two) times daily with a meal.    . metoprolol tartrate (LOPRESSOR) 25 MG tablet Take by mouth 2 (two) times daily.     . mirabegron ER (MYRBETRIQ) 50 MG TB24 tablet Take 50 mg by mouth daily.    . montelukast (SINGULAIR) 10 MG tablet Take 10 mg by mouth at bedtime.    . Multiple Vitamins-Minerals (CERTA PLUS) TABS Take 1 tablet by mouth daily.    . ondansetron (ZOFRAN) 4 MG tablet Take 1 tablet (4 mg total) by mouth every 8 (eight) hours as needed for nausea or vomiting. 30 tablet 1  . potassium chloride (K-DUR,KLOR-CON) 10 MEQ tablet Take 10 mEq by mouth 2 (two) times daily.    . pravastatin (PRAVACHOL) 40 MG tablet Take 40 mg by mouth daily.    . QUEtiapine (SEROQUEL) 300 MG tablet Take 600 mg by mouth at bedtime.    . sertraline (ZOLOFT) 25 MG tablet 50 mg daily.     . sitaGLIPtin (JANUVIA) 50 MG tablet Take 100 mg by mouth daily.     . SUSTIVA 600 MG tablet     . tiZANidine (ZANAFLEX) 4 MG tablet Take 4 mg by mouth daily.     . valsartan (DIOVAN) 320 MG tablet Take 160 mg by mouth daily.      No current facility-administered medications on file prior to visit.     Social History   Social History  . Marital status: Single    Spouse name: N/A  . Number of children: N/A  . Years of education: N/A   Occupational History  . Not on file.   Social History Main Topics  . Smoking status: Never Smoker  . Smokeless tobacco: Never Used     Comment: Never smoked  . Alcohol use No  . Drug use: No  . Sexual activity: No   Other Topics Concern  . Not on file   Social History Narrative  . No narrative on file     Family History  Problem Relation Age of Onset  . Diabetes Mother   . Hypertension Mother   . Stroke Mother   . Diabetes Father   . Hypertension Father   . Colon cancer Neg Hx     BP 111/72   Pulse 64   Temp 97.2 F (36.2 C)   Ht 5' 1.5" (  1.562 m)   Wt 207 lb (93.9 kg)   BMI 38.48 kg/m      Objective:   Physical Exam  Constitutional: She is oriented to person, place, and time. She appears well-developed and well-nourished.  HENT:  Head: Normocephalic and atraumatic.  Eyes: Conjunctivae and EOM are normal. Pupils are equal, round, and reactive to light.  Neck: Normal range of motion. Neck supple.  Cardiovascular: Normal rate, regular rhythm and intact distal pulses.   Pulmonary/Chest: Effort normal.  Abdominal: Soft.  Musculoskeletal: She exhibits tenderness (Pain both knees, slight effusion. ROM 0 to 100 right, 0 to 105 left, crepitus, poor gait, leans forward with walker, no distal edema, NV intact. ).  Neurological: She is alert and oriented to person, place, and time. She displays normal reflexes. No cranial nerve deficit. She exhibits normal muscle tone. Coordination normal.  Skin: Skin is warm and dry.  Psychiatric: She has a normal mood and affect. Her behavior is normal. Judgment and thought content normal.  Vitals reviewed.  X-rays were done of the right knee, reported separately.       Assessment & Plan:   Encounter Diagnoses  Name Primary?  . Chronic pain of right knee Yes  . Chronic pain of left knee    PROCEDURE NOTE:  The patient requests injections of the left knee , verbal consent was obtained.  The left knee was prepped appropriately after time out was performed.   Sterile technique was observed and injection of 1 cc of Depo-Medrol 40 mg with several cc's of plain xylocaine. Anesthesia was provided by ethyl chloride and a 20-gauge needle was used to inject the knee area. The injection was tolerated well.  A band aid dressing was  applied.  The patient was advised to apply ice later today and tomorrow to the injection sight as needed.  PROCEDURE NOTE:  The patient requests injections of the right knee , verbal consent was obtained.  The right knee was prepped appropriately after time out was performed.   Sterile technique was observed and injection of 1 cc of Depo-Medrol 40 mg with several cc's of plain xylocaine. Anesthesia was provided by ethyl chloride and a 20-gauge needle was used to inject the knee area. The injection was tolerated well.  A band aid dressing was applied.  The patient was advised to apply ice later today and tomorrow to the injection sight as needed.  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  She is a candidate for total knee.  However, I do not have her records to review.  She was recently seen at [facility] [place].  She has medical issues and may not be a candidate.  She needs to reduce some weight as well.  Return in two weeks.  Call if any problem.  Precautions discussed.  Electronically Signed Darreld McleanWayne Vin Yonke, MD 6/5/20182:42 PM

## 2016-12-06 ENCOUNTER — Telehealth: Payer: Self-pay | Admitting: Orthopaedic Surgery

## 2016-12-06 NOTE — Telephone Encounter (Signed)
Karen Mueller called asking if Dr. Hilda LiasKeeling could get her records from [facility]? I told her that I would ask what the status is. I did tell her that if we don't have the records that she may have to reschedule.  Please advise

## 2016-12-08 NOTE — Telephone Encounter (Signed)
Please reschedule the 12/14/16 apt.  We do not have the records yet.  Tell her we will schedule her when we receive the records.

## 2016-12-09 ENCOUNTER — Ambulatory Visit: Payer: 59 | Admitting: "Endocrinology

## 2016-12-09 NOTE — Telephone Encounter (Signed)
Called patient, notified.  Also called [facility] to check status of records request. Left voice message.

## 2016-12-10 NOTE — Telephone Encounter (Signed)
Records, Xray report received. Film to follow in mail. Patient aware.

## 2016-12-14 ENCOUNTER — Ambulatory Visit: Payer: 59 | Admitting: Orthopaedic Surgery

## 2016-12-21 ENCOUNTER — Encounter: Payer: Self-pay | Admitting: Orthopaedic Surgery

## 2016-12-21 ENCOUNTER — Ambulatory Visit (INDEPENDENT_AMBULATORY_CARE_PROVIDER_SITE_OTHER): Payer: 59 | Admitting: Orthopaedic Surgery

## 2016-12-21 VITALS — BP 117/72 | HR 78 | Temp 97.2°F | Ht 61.5 in | Wt 207.0 lb

## 2016-12-21 DIAGNOSIS — M25561 Pain in right knee: Secondary | ICD-10-CM | POA: Diagnosis not present

## 2016-12-21 DIAGNOSIS — G8929 Other chronic pain: Secondary | ICD-10-CM | POA: Diagnosis not present

## 2016-12-21 NOTE — Progress Notes (Signed)
Patient Karen:XWRUE Mueller, female DOB:1946/05/09, 71 y.o. AVW:[id number]  Chief Complaint  Patient presents with  . Follow-up    Chronic pain of right knee    HPI  Karen Mueller is a 71 y.o. female who has chronic pain of her right knee.  She had an injection last time which helped.  I have received the records from [facility] [place].  She has significant DJD of the right knee. She has pain and popping but no giving way. She is on multiple medicines. I have asked her to see her family doctor about her being able to have surgery on the knee.  She will do this.  She has no new trauma. HPI  Body mass index is 38.48 kg/m.  ROS  Review of Systems  HENT: Negative for congestion.   Respiratory: Positive for shortness of breath. Negative for cough.   Cardiovascular: Positive for palpitations. Negative for chest pain and leg swelling.  Endocrine: Positive for cold intolerance.  Musculoskeletal: Positive for arthralgias, back pain, gait problem, joint swelling and myalgias.  Allergic/Immunologic: Positive for environmental allergies.  Psychiatric/Behavioral: The patient is nervous/anxious.     Past Medical History:  Diagnosis Date  . Arthritis   . Asthma   . Chronic back pain   . Depression with anxiety   . Diabetes (HCC)   . GERD (gastroesophageal reflux disease)   . Heart disease   . HIV (human immunodeficiency virus infection) (HCC)   . Hypercholesterolemia   . Hypertension   . Hypothyroidism   . Schizophrenia (HCC)   . Sleep apnea   . Spinal stenosis     Past Surgical History:  Procedure Laterality Date  . BIOPSY N/A 01/16/2015   Procedure: BIOPSY;  Surgeon: Corbin Ade, MD;  Location: [facility];  Service: Endoscopy;  Laterality: N/A;  Gastric, Ascending, Descending/Sigmoid  . COLONOSCOPY WITH PROPOFOL N/A 01/16/2015   RMR: internal hemorrhoids/left-side diverticulosis, segmental biopsies negative  . CORONARY ANGIOPLASTY WITH STENT PLACEMENT     X 2  .  ESOPHAGOGASTRODUODENOSCOPY (EGD) WITH PROPOFOL N/A 01/16/2015   WGN:[id number] cervical esophageal with s/p dilation/HH. gastric erosions, benign and no h.pylori  . HERNIA REPAIR     umbilical  . MALONEY DILATION N/A 01/16/2015   Procedure: [name] DILATION;  Surgeon: Corbin Ade, MD;  Location: [facility];  Service: Endoscopy;  Laterality: N/A;  54/  . TUBAL LIGATION      Family History  Problem Relation Age of Onset  . Diabetes Mother   . Hypertension Mother   . Stroke Mother   . Diabetes Father   . Hypertension Father   . Colon cancer Neg Hx     Social History Social History  Substance Use Topics  . Smoking status: Never Smoker  . Smokeless tobacco: Never Used     Comment: Never smoked  . Alcohol use No    Allergies  Allergen Reactions  . Metronidazole Other (See Comments)    And itching  . Sulfa Antibiotics Other (See Comments)    And itching  . Benadryl [Diphenhydramine Hcl] Hives  . Other Other (See Comments)    Other Reaction: Allergy; cauliflower    Current Outpatient Prescriptions  Medication Sig Dispense Refill  . albuterol (PROVENTIL HFA;VENTOLIN HFA) 108 (90 BASE) MCG/ACT inhaler Inhale 2 puffs into the lungs every 4 (four) hours as needed for wheezing or shortness of breath.    Marland Kitchen amLODipine (NORVASC) 10 MG tablet Take 10 mg by mouth daily.    Marland Kitchen aspirin 81 MG tablet  Take 81 mg by mouth daily.    . benztropine (COGENTIN) 1 MG tablet 1 mg    . canagliflozin (INVOKANA) 100 MG TABS tablet Take 1 tablet (100 mg total) by mouth daily before breakfast. 30 tablet 2  . clopidogrel (PLAVIX) 75 MG tablet Take 75 mg by mouth daily.    . cyanocobalamin (,VITAMIN B-12,) 1000 MCG/ML injection Inject 1,000 mcg into the muscle once.    Marland Kitchen. dexlansoprazole (DEXILANT) 60 MG capsule Take by mouth.    . dicyclomine (BENTYL) 10 MG capsule TAKE 1 CAPSULE(10 MG) BY MOUTH FOUR TIMES DAILY BEFORE MEALS AND AT BEDTIME as needed for cramps/diarrhea 120 capsule 1  . docusate sodium (COLACE)  100 MG capsule Take 1 capsule (100 mg total) by mouth 2 (two) times daily. 60 capsule 5  . emtricitabine-tenofovir (TRUVADA) 200-300 MG per tablet Take 1 tablet by mouth daily.    . ergocalciferol (VITAMIN D2) 50000 UNITS capsule Take 50,000 Units by mouth once a week.    . gabapentin (NEURONTIN) 600 MG tablet Take 600 mg by mouth at bedtime.    . hydrocortisone (ANUSOL-HC) 2.5 % rectal cream Place 1 application rectally 2 (two) times daily. 30 g 0  . hydrOXYzine (VISTARIL) 50 MG capsule 3 (three) times daily before meals. 50 mg    . Lancets (ONETOUCH ULTRASOFT) lancets Use as instructed 4 x daily. E11.65 150 each 5  . levothyroxine (SYNTHROID, LEVOTHROID) 125 MCG tablet Take 1 tablet (125 mcg total) by mouth daily before breakfast. 90 tablet 2  . lubiprostone (AMITIZA) 24 MCG capsule Take 24 mcg by mouth 2 (two) times daily with a meal.    . metoprolol tartrate (LOPRESSOR) 25 MG tablet Take by mouth 2 (two) times daily.     . mirabegron ER (MYRBETRIQ) 50 MG TB24 tablet Take 50 mg by mouth daily.    . montelukast (SINGULAIR) 10 MG tablet Take 10 mg by mouth at bedtime.    . Multiple Vitamins-Minerals (CERTA PLUS) TABS Take 1 tablet by mouth daily.    . ondansetron (ZOFRAN) 4 MG tablet Take 1 tablet (4 mg total) by mouth every 8 (eight) hours as needed for nausea or vomiting. 30 tablet 1  . potassium chloride (K-DUR,KLOR-CON) 10 MEQ tablet Take 10 mEq by mouth 2 (two) times daily.    . pravastatin (PRAVACHOL) 40 MG tablet Take 40 mg by mouth daily.    . QUEtiapine (SEROQUEL) 300 MG tablet Take 600 mg by mouth at bedtime.    . sertraline (ZOLOFT) 25 MG tablet 50 mg daily.     . sitaGLIPtin (JANUVIA) 50 MG tablet Take 100 mg by mouth daily.     . SUSTIVA 600 MG tablet     . tiZANidine (ZANAFLEX) 4 MG tablet Take 4 mg by mouth daily.     . valsartan (DIOVAN) 320 MG tablet Take 160 mg by mouth daily.      No current facility-administered medications for this visit.      Physical Exam  Blood  pressure 117/72, pulse 78, temperature 97.2 F (36.2 C), height 5' 1.5" (1.562 m), weight 207 lb (93.9 kg).  Constitutional: overall normal hygiene, normal nutrition, well developed, normal grooming, normal body habitus. Assistive device:walker  Musculoskeletal: gait and station Limp right, muscle tone and strength are normal, no tremors or atrophy is present.  .  Neurological: coordination overall normal.  Deep tendon reflex/nerve stretch intact.  Sensation normal.  Cranial nerves II-XII intact.   Skin:   Normal overall no scars, lesions,  ulcers or rashes. No psoriasis.  Psychiatric: Alert and oriented x 3.  Recent memory intact, remote memory unclear.  Normal mood and affect. Well groomed.  Good eye contact.  Cardiovascular: overall no swelling, no varicosities, no edema bilaterally, normal temperatures of the legs and arms, no clubbing, cyanosis and good capillary refill.  Lymphatic: palpation is normal.  The right lower extremity is examined:  Inspection:  Thigh:  Non-tender and no defects  Knee has swelling 1+ effusion.                        Joint tenderness is present                        Patient is tender over the medial joint line  Lower Leg:  Has normal appearance and no tenderness or defects  Ankle:  Non-tender and no defects  Foot:  Non-tender and no defects Range of Motion:  Knee:  Range of motion is: 0-100                        Crepitus is  present  Ankle:  Range of motion is normal. Strength and Tone:  The right lower extremity has normal strength and tone. Stability:  Knee:  The knee is stable.  Ankle:  The ankle is stable.    The patient has been educated about the nature of the problem(s) and counseled on treatment options.  The patient appeared to understand what I have discussed and is in agreement with it.  Encounter Diagnosis  Name Primary?  . Chronic pain of right knee Yes    PLAN Call if any problems.  Precautions discussed.  Continue current  medications.   Return to clinic 1 month   Electronically Signed Darreld Mclean, MD 6/26/20182:44 PM

## 2017-01-18 ENCOUNTER — Ambulatory Visit (INDEPENDENT_AMBULATORY_CARE_PROVIDER_SITE_OTHER): Payer: 59 | Admitting: Orthopaedic Surgery

## 2017-01-18 ENCOUNTER — Ambulatory Visit: Payer: 59 | Admitting: "Endocrinology

## 2017-01-18 ENCOUNTER — Encounter: Payer: Self-pay | Admitting: Orthopaedic Surgery

## 2017-01-18 VITALS — BP 105/72 | HR 71 | Temp 98.3°F | Ht 61.5 in | Wt 207.0 lb

## 2017-01-18 DIAGNOSIS — M25561 Pain in right knee: Secondary | ICD-10-CM | POA: Diagnosis not present

## 2017-01-18 DIAGNOSIS — G8929 Other chronic pain: Secondary | ICD-10-CM | POA: Diagnosis not present

## 2017-01-18 DIAGNOSIS — M25562 Pain in left knee: Secondary | ICD-10-CM | POA: Diagnosis not present

## 2017-01-18 NOTE — Progress Notes (Signed)
CC: Both of my knees are hurting. I would like an injection in both knees.  The patient has had chronic pain and tenderness of both knees for some time.  Injections help.  There is no locking or giving way of the knee.  There is no new trauma. There is no redness or signs of infections.  The knees have a mild effusion and some crepitus.  There is no redness or signs of recent trauma.  Right knee ROM is 0-100 and left knee ROM is 0-95.  Impression:  Chronic pain of the both knees  Return:  1 month  PROCEDURE NOTE:  The patient requests injections of both knees, verbal consent was obtained.  The left and right knee were individually prepped appropriately after time out was performed.   Sterile technique was observed and injection of 1 cc of Depo-Medrol 40 mg with several cc's of plain xylocaine. Anesthesia was provided by ethyl chloride and a 20-gauge needle was used to inject each knee area. The injections were tolerated well.  A band aid dressing was applied.  The patient was advised to apply ice later today and tomorrow to the injection sight as needed.   Electronically Signed Darreld McleanWayne Dinisha Cai, MD 7/24/20183:25 PM

## 2017-01-19 LAB — BASIC METABOLIC PANEL
BUN: 14 (ref 4–21)
CREATININE: 1 (ref <=1.1)
POTASSIUM: 4.5 (ref 3.4–5.3)

## 2017-01-19 LAB — HEMOGLOBIN A1C: HEMOGLOBIN A1C: 8.3

## 2017-01-24 ENCOUNTER — Other Ambulatory Visit: Payer: Self-pay

## 2017-01-24 ENCOUNTER — Ambulatory Visit: Payer: 59 | Admitting: "Endocrinology

## 2017-01-24 MED ORDER — CANAGLIFLOZIN 100 MG PO TABS: 100.0000 mg | ORAL_TABLET | Freq: Every day | ORAL | 2 refills | Status: DC

## 2017-01-25 ENCOUNTER — Ambulatory Visit: Payer: 59 | Admitting: "Endocrinology

## 2017-01-25 MED ORDER — ONDANSETRON HCL 4 MG PO TABS: 4.0000 mg | ORAL_TABLET | Freq: Three times a day (TID) | ORAL | 3 refills | Status: DC | PRN

## 2017-01-25 MED ORDER — DICYCLOMINE HCL 10 MG PO CAPS: ORAL_CAPSULE | ORAL | 3 refills | Status: DC

## 2017-01-28 ENCOUNTER — Other Ambulatory Visit: Payer: Self-pay

## 2017-01-28 MED ORDER — CANAGLIFLOZIN 100 MG PO TABS: 100.0000 mg | ORAL_TABLET | Freq: Every day | ORAL | 2 refills | Status: DC

## 2017-02-01 ENCOUNTER — Other Ambulatory Visit: Payer: Self-pay | Admitting: *Deleted

## 2017-02-01 MED ORDER — CANAGLIFLOZIN 100 MG PO TABS: 100.0000 mg | ORAL_TABLET | Freq: Every day | ORAL | 2 refills | Status: DC

## 2017-02-08 ENCOUNTER — Ambulatory Visit (INDEPENDENT_AMBULATORY_CARE_PROVIDER_SITE_OTHER): Payer: 59 | Admitting: "Endocrinology

## 2017-02-08 ENCOUNTER — Encounter: Payer: Self-pay | Admitting: "Endocrinology

## 2017-02-08 ENCOUNTER — Encounter: Payer: 59 | Attending: "Endocrinology | Admitting: Nutrition

## 2017-02-08 VITALS — BP 127/80 | HR 64 | Ht 61.5 in | Wt 215.0 lb

## 2017-02-08 VITALS — Wt 215.0 lb

## 2017-02-08 DIAGNOSIS — E669 Obesity, unspecified: Secondary | ICD-10-CM

## 2017-02-08 DIAGNOSIS — E1165 Type 2 diabetes mellitus with hyperglycemia: Secondary | ICD-10-CM | POA: Insufficient documentation

## 2017-02-08 DIAGNOSIS — E118 Type 2 diabetes mellitus with unspecified complications: Secondary | ICD-10-CM | POA: Insufficient documentation

## 2017-02-08 DIAGNOSIS — E1159 Type 2 diabetes mellitus with other circulatory complications: Secondary | ICD-10-CM

## 2017-02-08 DIAGNOSIS — I10 Essential (primary) hypertension: Secondary | ICD-10-CM | POA: Diagnosis not present

## 2017-02-08 DIAGNOSIS — E038 Other specified hypothyroidism: Secondary | ICD-10-CM

## 2017-02-08 DIAGNOSIS — Z713 Dietary counseling and surveillance: Secondary | ICD-10-CM | POA: Diagnosis not present

## 2017-02-08 DIAGNOSIS — IMO0002 Reserved for concepts with insufficient information to code with codable children: Secondary | ICD-10-CM

## 2017-02-08 DIAGNOSIS — E782 Mixed hyperlipidemia: Secondary | ICD-10-CM

## 2017-02-08 NOTE — Progress Notes (Signed)
Subjective:    Patient ID: Karen CavalierPatsy Mungia, female    DOB: Aug 25, 1945,    Past Medical History:  Diagnosis Date  . Arthritis   . Asthma   . Chronic back pain   . Depression with anxiety   . Diabetes (HCC)   . GERD (gastroesophageal reflux disease)   . Heart disease   . HIV (human immunodeficiency virus infection) (HCC)   . Hypercholesterolemia   . Hypertension   . Hypothyroidism   . Schizophrenia (HCC)   . Sleep apnea   . Spinal stenosis    Past Surgical History:  Procedure Laterality Date  . BIOPSY N/A 01/16/2015   Procedure: BIOPSY;  Surgeon: Corbin Adeobert M Rourk, MD;  Location: [facility];  Service: Endoscopy;  Laterality: N/A;  Gastric, Ascending, Descending/Sigmoid  . COLONOSCOPY WITH PROPOFOL N/A 01/16/2015   RMR: internal hemorrhoids/left-side diverticulosis, segmental biopsies negative  . CORONARY ANGIOPLASTY WITH STENT PLACEMENT     X 2  . ESOPHAGOGASTRODUODENOSCOPY (EGD) WITH PROPOFOL N/A 01/16/2015   ZOX:WRUEAVWRMR:probale cervical esophageal with s/p dilation/HH. gastric erosions, benign and no h.pylori  . HERNIA REPAIR     umbilical  . MALONEY DILATION N/A 01/16/2015   Procedure: Elease HashimotoMALONEY DILATION;  Surgeon: Corbin Adeobert M Rourk, MD;  Location: [facility];  Service: Endoscopy;  Laterality: N/A;  56/  . TUBAL LIGATION     Social History   Social History  . Marital status: Single    Spouse name: N/A  . Number of children: N/A  . Years of education: N/A   Social History Main Topics  . Smoking status: Never Smoker  . Smokeless tobacco: Never Used     Comment: Never smoked  . Alcohol use No  . Drug use: No  . Sexual activity: No   Other Topics Concern  . None   Social History Narrative  . None   Outpatient Encounter Prescriptions as of 02/08/2017  Medication Sig  . aspirin 81 MG tablet Take 81 mg by mouth daily.  . benztropine (COGENTIN) 1 MG tablet 1 mg  . bictegravir-emtricitabine-tenofovir AF (BIKTARVY) 50-200-25 MG TABS tablet Take by mouth daily.  . canagliflozin (INVOKANA)  100 MG TABS tablet Take 1 tablet (100 mg total) by mouth daily before breakfast.  . clopidogrel (PLAVIX) 75 MG tablet Take 75 mg by mouth daily.  Marland Kitchen. dexlansoprazole (DEXILANT) 60 MG capsule Take by mouth.  . dicyclomine (BENTYL) 10 MG capsule TAKE 1 CAPSULE(10 MG) BY MOUTH FOUR TIMES DAILY BEFORE MEALS AND AT BEDTIME as needed for cramps/diarrhea  . docusate sodium (COLACE) 100 MG capsule Take 1 capsule (100 mg total) by mouth 2 (two) times daily.  . ergocalciferol (VITAMIN D2) 50000 UNITS capsule Take 50,000 Units by mouth once a week.  . gabapentin (NEURONTIN) 600 MG tablet Take 600 mg by mouth at bedtime.  . hydrOXYzine (VISTARIL) 50 MG capsule 3 (three) times daily before meals. 50 mg  . levothyroxine (SYNTHROID, LEVOTHROID) 125 MCG tablet Take 1 tablet (125 mcg total) by mouth daily before breakfast.  . lubiprostone (AMITIZA) 24 MCG capsule Take 24 mcg by mouth 2 (two) times daily with a meal.  . metoprolol tartrate (LOPRESSOR) 25 MG tablet Take 50 mg by mouth 2 (two) times daily.   . mirabegron ER (MYRBETRIQ) 50 MG TB24 tablet Take 50 mg by mouth daily.  . montelukast (SINGULAIR) 10 MG tablet Take 10 mg by mouth at bedtime.  . Multiple Vitamins-Minerals (CERTA PLUS) TABS Take 1 tablet by mouth daily.  . Omega-3 Fatty Acids (OMEGA-3  FISH OIL PO) Take by mouth 2 (two) times daily.  . potassium chloride (K-DUR,KLOR-CON) 10 MEQ tablet Take 10 mEq by mouth 2 (two) times daily.  . pravastatin (PRAVACHOL) 40 MG tablet Take 40 mg by mouth daily.  . QUEtiapine (SEROQUEL) 300 MG tablet Take 600 mg by mouth at bedtime.  . sertraline (ZOLOFT) 25 MG tablet 50 mg daily.   . sitaGLIPtin (JANUVIA) 50 MG tablet Take 100 mg by mouth daily.   Marland Kitchen tiZANidine (ZANAFLEX) 4 MG tablet Take 4 mg by mouth daily.   . valsartan (DIOVAN) 320 MG tablet Take 160 mg by mouth daily.   . [DISCONTINUED] albuterol (PROVENTIL HFA;VENTOLIN HFA) 108 (90 BASE) MCG/ACT inhaler Inhale 2 puffs into the lungs every 4 (four) hours as  needed for wheezing or shortness of breath.  . [DISCONTINUED] amLODipine (NORVASC) 10 MG tablet Take 10 mg by mouth daily.  . [DISCONTINUED] cyanocobalamin (,VITAMIN B-12,) 1000 MCG/ML injection Inject 1,000 mcg into the muscle once.  . [DISCONTINUED] emtricitabine-tenofovir (TRUVADA) 200-300 MG per tablet Take 1 tablet by mouth daily.  . [DISCONTINUED] hydrocortisone (ANUSOL-HC) 2.5 % rectal cream Place 1 application rectally 2 (two) times daily.  . [DISCONTINUED] Lancets (ONETOUCH ULTRASOFT) lancets Use as instructed 4 x daily. E11.65  . [DISCONTINUED] ondansetron (ZOFRAN) 4 MG tablet Take 1 tablet (4 mg total) by mouth every 8 (eight) hours as needed for nausea or vomiting.  . [DISCONTINUED] SUSTIVA 600 MG tablet    No facility-administered encounter medications on file as of 02/08/2017.    ALLERGIES: Allergies  Allergen Reactions  . Metronidazole Other (See Comments)    And itching  . Sulfa Antibiotics Other (See Comments)    And itching  . Benadryl [Diphenhydramine Hcl] Hives  . Other Other (See Comments)    Other Reaction: Allergy; cauliflower   VACCINATION STATUS:  There is no immunization history on file for this patient.  Diabetes  She presents for her follow-up diabetic visit. She has type 2 diabetes mellitus. Her disease course has been stable. Pertinent negatives for hypoglycemia include no confusion, headaches, pallor or seizures. Associated symptoms include fatigue, polydipsia and polyuria. Pertinent negatives for diabetes include no chest pain and no polyphagia. Symptoms are stable. Diabetic complications include peripheral neuropathy and PVD. Risk factors for coronary artery disease include diabetes mellitus, dyslipidemia, hypertension, obesity and sedentary lifestyle. Current diabetic treatment includes insulin injections and oral agent (monotherapy) (She is unreliable to use insulin. She reports that she injects insulin at random. She did not monitor blood glucose in the  last 3 weeks at least. She did not bring her meter nor logs to review.). She is compliant with treatment some of the time. Her weight is increasing steadily. She is following a generally unhealthy diet. When asked about meal planning, she reported none. She has had a previous visit with a dietitian. She never participates in exercise. There is no change (She did not monitor blood glucose, her A1c is 8.5% improving from  9.1%.) in her home blood glucose trend. (  ) An ACE inhibitor/angiotensin II receptor blocker is being taken.  Hyperlipidemia  This is a chronic problem. The current episode started more than 1 year ago. Exacerbating diseases include diabetes and obesity. Pertinent negatives include no chest pain, myalgias or shortness of breath. Risk factors for coronary artery disease include dyslipidemia, diabetes mellitus and hypertension.  Hypertension  This is a chronic problem. The current episode started more than 1 year ago. Pertinent negatives include no chest pain, headaches, palpitations or shortness  of breath. Risk factors for coronary artery disease include dyslipidemia, diabetes mellitus and sedentary lifestyle. Past treatments include angiotensin blockers. Hypertensive end-organ damage includes CAD/MI and PVD.     Review of Systems  Constitutional: Positive for fatigue. Negative for unexpected weight change.  HENT: Negative for trouble swallowing and voice change.   Eyes: Negative for visual disturbance.  Respiratory: Negative for cough, shortness of breath and wheezing.   Cardiovascular: Negative for chest pain, palpitations and leg swelling.  Gastrointestinal: Negative for diarrhea, nausea and vomiting.  Endocrine: Positive for polydipsia and polyuria. Negative for cold intolerance, heat intolerance and polyphagia.  Musculoskeletal: Positive for gait problem. Negative for arthralgias and myalgias.       She is using a walker to get around.  Skin: Negative for color change,  pallor, rash and wound.  Neurological: Negative for seizures and headaches.  Psychiatric/Behavioral: Negative for confusion and suicidal ideas.    Objective:    BP 127/80   Pulse 64   Ht 5' 1.5" (1.562 m)   Wt 215 lb (97.5 kg)   BMI 39.97 kg/m   Wt Readings from Last 3 Encounters:  02/08/17 215 lb (97.5 kg)  01/18/17 207 lb (93.9 kg)  12/21/16 207 lb (93.9 kg)    Physical Exam  Constitutional: She is oriented to person, place, and time. She appears well-developed.  HENT:  Head: Normocephalic and atraumatic.  Eyes: EOM are normal.  Neck: Normal range of motion. Neck supple. No tracheal deviation present. No thyromegaly present.  Cardiovascular: Normal rate and regular rhythm.   Pulmonary/Chest: Effort normal and breath sounds normal.  Abdominal: Soft. Bowel sounds are normal. There is no tenderness. There is no guarding.  Musculoskeletal: She exhibits no edema.  She is using a walker to get around. She is out of the wheelchair that she used during last visit.  Neurological: She is alert and oriented to person, place, and time. She has normal reflexes. No cranial nerve deficit. Coordination normal.  Skin: Skin is warm and dry. No rash noted. No erythema. No pallor.  Psychiatric: She has a normal mood and affect. Judgment normal.    Results for orders placed or performed in visit on 08/09/16  Hemoglobin A1c  Result Value Ref Range   Hemoglobin A1C 8.5   TSH  Result Value Ref Range   TSH 1.21 0.41 - 5.90 uIU/mL   Complete Blood Count (Most recent): Lab Results  Component Value Date   WBC 4.2 06/22/2016   HGB 13.8 06/22/2016   HCT 41.4 06/22/2016   MCV 87 06/22/2016   PLT 283 06/22/2016   Chemistry (most recent): Lab Results  Component Value Date   NA 136 01/13/2015   K 4.7 01/13/2015   CL 103 01/13/2015   CO2 22 01/13/2015   BUN 20 01/13/2015   CREATININE 1.20 (H) 01/13/2015   Diabetic Labs (most recent): Lab Results  Component Value Date   HGBA1C 8.5  08/03/2016   HGBA1C 9.1 04/27/2016   HGBA1C 8.1 01/21/2016   Assessment & Plan:   1. Type 2 diabetes mellitus with vascular disease (HCC)   Her diabetes is  complicated by coronary artery disease. Patient came without any meter nor logs. -Her Recent repeat A1c is 8.5%, generally improving from 9.1%.   -  Recent labs reviewed. - Patient remains at a high risk for more acute and chronic complications of diabetes which include CAD, CVA, CKD, retinopathy, and neuropathy. These are all discussed in detail with the patient.  - I have  re-counseled the patient on diet management and weight loss  by adopting a carbohydrate restricted / protein rich  Diet. - Patient is advised to stick to a routine mealtimes to eat 3 meals  a day and avoid unnecessary snacks ( to snack only to correct hypoglycemia).  - Suggestion is made for patient to avoid simple carbohydrates   from her diet including Cakes , sweets Desserts, Ice Cream,  store bought  juices, Soda (  diet and regular) , Sweet Tea , Candies,  Chips, Cookies, Artificial Sweeteners,   and "Sugar-free" Products .  This will help patient to have stable blood glucose profile and potentially avoid unintended  Weight gain.  - I have approached patient with the following individualized plan to manage diabetes and patient agrees.  -  The #1 goal in her therapy will be avoiding hypoglycemia.  - Given her significant social problem. Patient is struggling to follow monitoring and was overwhelmed with insulin administration instructions. - She is not reliable for safe use of insulin.  Ideally, she needs assistance with her diabetes by a skilled personnel either at home or at group home. However she is against placement at this point. - In the meantime, I will continue  Invokana 100 mg po qam, side effects and precautions discussed with her, and Januvia 100 mg by mouth by mouth every morning.  -Patient is encouraged to call clinic for blood glucose levels  less than 70 or above 300 mg /dl.  -She does not tolerate metformin, she has CKD  GFR of 51 improving from 45 , she would not tolerate a larger dose of Invokana. - She does not want to go to a group/nursing home at this time. - Patient specific target  for A1c; LDL, HDL, Triglycerides, and  Waist Circumference were discussed in detail.  2) BP/HTN:  controlled. Continue current medications including ARB.  3) Lipids/HPL: continue statins. 4)  Weight/Diet: CDE consult in progress, exercise, and carbohydrates information provided.  5) hypothyroidism:  - Her recent thyroid function tests are consistent with appropriate replacement.  -  I advised her to continue  levothyroxine to 125 g by mouth every morning.  - We discussed about correct intake of levothyroxine, at fasting, with water, separated by at least 30 minutes from breakfast, and separated by more than 4 hours from calcium, iron, multivitamins, acid reflux medications (PPIs). -Patient is made aware of the fact that thyroid hormone replacement is needed for life, dose to be adjusted by periodic monitoring of thyroid function tests.  6) Chronic Care/Health Maintenance:  -Patient is ARB and Statin medications and encouraged to continue to follow up with Ophthalmology, Podiatrist at least yearly or according to recommendations, and advised to  stay away from smoking. I have recommended yearly flu vaccine and pneumonia vaccination at least every 5 years;  and  sleep for at least 7 hours a day. - Time spent with the patient: 25 min, of which >50% was spent in reviewing her sugar logs , discussing her hypo- and hyper-glycemic episodes, reviewing  previous labs and insulin doses and developing a plan to avoid hypo- and hyper-glycemia.   I advised patient to maintain close follow up with their PCP for primary care needs.  Patient is asked to bring meter and  blood glucose logs during her next visit.   Follow up plan: Return in about 6 months  (around 08/11/2017) for follow up with pre-visit labs.  Marquis Lunch, MD Phone: [phone]  Fax: [phone]   02/08/2017, 11:23  AM

## 2017-02-08 NOTE — Patient Instructions (Signed)
Advice for Weight Management -For most of us the best way to lose weight is by diet management. Generally speaking, diet management means restricting carbohydrate consumption to minimum possible (and to unprocessed or minimally processed complex starch) and increasing protein intake (animal or plant source), fruits, and vegetables.  -Sticking to a routine mealtime to eat 3 meals a day and avoiding unnecessary snacks is shown to have a big role in weight control.  -It is better to avoid simple carbohydrates including: Cakes, Sweet Desserts, Ice Cream, Soda (diet and regular), Sweet Tea, Candies, Chips, Cookies, Store Bought Juices, Alcohol in Excess of  1-2 drinks a day, Artificial Sweeteners, and "Sugar-free" Products.   -Exercise: 30 -60 minutes a day 3-4 days a week, or 150 minutes a week. Combine stretch, strength, and aerobic activities. You may seek evaluation by your heart doctor prior to initiating exercise if you have high risk for heart disease.  -If you are interested, we can schedule a visit with Karen Mueller, RDN, CDE for individualized nutrition education.  

## 2017-02-08 NOTE — Patient Instructions (Signed)
Goals  Eat cherrios instead of Captain Crunch Cut out sodas and only drink water. Use Mrs Sharilyn SitesDash instead of Accent Goals get morning blood sugar less than 150  Eat breakfast 8-9 am Lunch 12-2 pm and dinner 5-7 pm

## 2017-02-08 NOTE — Progress Notes (Signed)
   Medical Nutrition Therapy:  Appt start time: 1115  end time: 1130Assessment:  Primary concerns today: Diabetes Type 2 DM.  Admits to drinking sodas and sugared cereals. A1C 8.3%. Brought her meter but lost her log sheets when cleaning up. Saw Dr. Fransico HimNida today. Has a new care giver that will work with her starting tomorrow.    Gained 8 lbs since last visit. Remains non compliant with diet recommendations.    Wt Readings from Last 3 Encounters:  02/08/17 215 lb (97.5 kg)  02/08/17 215 lb (97.5 kg)  01/18/17 207 lb (93.9 kg)   Ht Readings from Last 3 Encounters:  02/08/17 5' 1.5" (1.562 m)  01/18/17 5' 1.5" (1.562 m)  12/21/16 5' 1.5" (1.562 m)   Body mass index is 39.97 kg/m.  Preferred Learning Style:   No preference indicated   Learning Readiness:   Ready  Change in progress  MEDICATIONS: See List   DIETARY INTAKE:  Breakfast:  Benita Gutteraptain Krunch, or eggs and bacon  Lunch:  Baked chicken, string beans, Sodas  Dinner:  Rice, and chicken, string beans, water  Estimated energy needs: 1200 calories 135 g carbohydrates 90 g protein 33 g fat  Progress Towards Goal(s):  In progress.   Nutritional Diagnosis:  NB-1.1 Food and nutrition-related knowledge deficit As related to Diabetes.  As evidenced by A1C 8.2%..   Intervention: Nutrition and Diabetes education provided on My Plate, CHO counting, meal planning, portion sizes, timing of meals, avoiding snacks between meals unless having a low blood sugar, target ranges for A1C and blood sugars, signs/symptoms and treatment of hyper/hypoglycemia, monitoring blood sugars, taking medications as prescribed, benefits of exercising 30 minutes per day and prevention of complications of DM.   Goals Need to comply with your diet Drink more water 5-6 bottles per day.  Goals  Eat cherrios instead of Captain Crunch Cut out sodas and only drink water. Use Mrs Sharilyn SitesDash instead of Accent Goals get morning blood sugar less than 150  Eat  breakfast 8-9 am Lunch 12-2 pm and dinner 5-7 pm  Teaching Method Utilized: Visual Auditory Hands on  Handouts given during visit include:  The Plate Method  Meal Plan Card  Diabetes Instructions.  Barriers to learning/adherence to lifestyle change: Age, inability to remember to take meds on time. Unable to prepare own meals.  Demonstrated degree of understanding via:  Teach Back   Monitoring/Evaluation:  Dietary intake, exercise, meal planning, SBG, and body weight in 3 month(s).  She may benefit from nursing home or assisted living to better assist her with her medications, meals and mobility needs. However, she doesn't want to go to a facility.

## 2017-02-10 ENCOUNTER — Encounter: Payer: Self-pay | Admitting: Internal Medicine

## 2017-02-10 ENCOUNTER — Ambulatory Visit: Payer: 59 | Admitting: Nutrition

## 2017-02-16 ENCOUNTER — Ambulatory Visit (INDEPENDENT_AMBULATORY_CARE_PROVIDER_SITE_OTHER): Payer: 59 | Admitting: Orthopaedic Surgery

## 2017-02-16 DIAGNOSIS — M25561 Pain in right knee: Secondary | ICD-10-CM | POA: Diagnosis not present

## 2017-02-16 DIAGNOSIS — M25562 Pain in left knee: Secondary | ICD-10-CM | POA: Diagnosis not present

## 2017-02-16 DIAGNOSIS — G8929 Other chronic pain: Secondary | ICD-10-CM | POA: Diagnosis not present

## 2017-02-16 NOTE — Progress Notes (Signed)
CC: Both of my knees are hurting. I would like an injection in both knees.  The patient has had chronic pain and tenderness of both knees for some time.  Injections help.  There is no locking or giving way of the knee.  There is no new trauma. There is no redness or signs of infections.  The knees have a mild effusion and some crepitus.  There is no redness or signs of recent trauma.  Right knee ROM is 0-95 and left knee ROM is 0-100.  Impression:  Chronic pain of the both knees  Return:  1 month  PROCEDURE NOTE:  The patient requests injections of both knees, verbal consent was obtained.  The left and right knee were individually prepped appropriately after time out was performed.   Sterile technique was observed and injection of 1 cc of Depo-Medrol 40 mg with several cc's of plain xylocaine. Anesthesia was provided by ethyl chloride and a 20-gauge needle was used to inject each knee area. The injections were tolerated well.  A band aid dressing was applied.  The patient was advised to apply ice later today and tomorrow to the injection sight as needed.  Electronically Signed Darreld Mclean, MD 8/22/20182:19 PM

## 2017-03-16 ENCOUNTER — Ambulatory Visit: Payer: 59 | Admitting: Orthopaedic Surgery

## 2017-03-17 ENCOUNTER — Ambulatory Visit: Payer: 59 | Admitting: Orthopaedic Surgery

## 2017-03-31 ENCOUNTER — Ambulatory Visit: Payer: 59 | Admitting: Orthopaedic Surgery

## 2017-04-05 ENCOUNTER — Encounter: Payer: Self-pay | Admitting: Orthopaedic Surgery

## 2017-04-05 ENCOUNTER — Ambulatory Visit (INDEPENDENT_AMBULATORY_CARE_PROVIDER_SITE_OTHER): Payer: 59 | Admitting: Orthopaedic Surgery

## 2017-04-05 VITALS — BP 136/85 | HR 72 | Temp 97.5°F | Ht 61.5 in

## 2017-04-05 DIAGNOSIS — M25561 Pain in right knee: Secondary | ICD-10-CM | POA: Diagnosis not present

## 2017-04-05 DIAGNOSIS — G8929 Other chronic pain: Secondary | ICD-10-CM

## 2017-04-05 DIAGNOSIS — M25562 Pain in left knee: Secondary | ICD-10-CM

## 2017-04-05 NOTE — Progress Notes (Signed)
CC: Both of my knees are hurting. I would like an injection in both knees.  The patient has had chronic pain and tenderness of both knees for some time.  Injections help.  There is no locking or giving way of the knee.  There is no new trauma. There is no redness or signs of infections.  The knees have a mild effusion and some crepitus.  There is no redness or signs of recent trauma.  Right knee ROM is 0-100 and left knee ROM is 0-95.  Impression:  Chronic pain of the both knees  Return:  6 weeks  PROCEDURE NOTE:  The patient requests injections of both knees, verbal consent was obtained.  The left and right knee were individually prepped appropriately after time out was performed.   Sterile technique was observed and injection of 1 cc of Depo-Medrol 40 mg with several cc's of plain xylocaine. Anesthesia was provided by ethyl chloride and a 20-gauge needle was used to inject each knee area. The injections were tolerated well.  A band aid dressing was applied.  The patient was advised to apply ice later today and tomorrow to the injection sight as needed.  Electronically Signed Darreld Mclean, MD 10/9/20182:27 PM

## 2017-04-07 ENCOUNTER — Other Ambulatory Visit: Payer: Self-pay

## 2017-04-11 MED ORDER — DOCUSATE SODIUM 100 MG PO CAPS: 100.0000 mg | ORAL_CAPSULE | Freq: Two times a day (BID) | ORAL | 3 refills | Status: DC

## 2017-05-10 ENCOUNTER — Other Ambulatory Visit: Payer: Self-pay

## 2017-05-10 MED ORDER — GLUCOSE BLOOD VI STRP: 1.0000 | ORAL_STRIP | 5 refills | Status: DC | PRN

## 2017-05-11 ENCOUNTER — Other Ambulatory Visit: Payer: Self-pay

## 2017-05-11 ENCOUNTER — Telehealth: Payer: Self-pay | Admitting: Internal Medicine

## 2017-05-11 MED ORDER — CANAGLIFLOZIN 100 MG PO TABS: 100.0000 mg | ORAL_TABLET | Freq: Every day | ORAL | 1 refills | Status: DC

## 2017-05-11 NOTE — Telephone Encounter (Signed)
Patient called and left message for someone to call her. She is having stomach pain [phone]

## 2017-05-11 NOTE — Telephone Encounter (Signed)
Spoke with pt, C/o stomach pain, diarrhea comes and goes day to day, severe nausea and water comes up from time to time. Pt would like an appointment with [facility]. Pt wasn't able to complete call due to water coming up as she described. I wasn't able to see what medications pt is taking.   I will call pt back this morning to finish triage call.

## 2017-05-12 NOTE — Telephone Encounter (Signed)
Patient needs ov. Last seen 10/2016.

## 2017-05-12 NOTE — Telephone Encounter (Signed)
Please schedule appointment.

## 2017-05-16 ENCOUNTER — Other Ambulatory Visit: Payer: Self-pay

## 2017-05-16 DIAGNOSIS — R1110 Vomiting, unspecified: Secondary | ICD-10-CM

## 2017-05-16 NOTE — Telephone Encounter (Signed)
PATIENT SCHEDULED  °

## 2017-05-17 ENCOUNTER — Ambulatory Visit: Payer: 59 | Admitting: Orthopaedic Surgery

## 2017-05-17 ENCOUNTER — Encounter: Payer: Self-pay | Admitting: Orthopaedic Surgery

## 2017-05-23 ENCOUNTER — Other Ambulatory Visit: Payer: Self-pay

## 2017-05-24 ENCOUNTER — Encounter: Payer: Self-pay | Admitting: Internal Medicine

## 2017-05-24 ENCOUNTER — Ambulatory Visit: Payer: 59 | Admitting: Nurse Practitioner

## 2017-05-24 ENCOUNTER — Telehealth: Payer: Self-pay | Admitting: Nurse Practitioner

## 2017-05-24 NOTE — Telephone Encounter (Signed)
Noted  

## 2017-05-24 NOTE — Telephone Encounter (Signed)
Patient was a no show and letter sent  °

## 2017-05-25 MED ORDER — DICYCLOMINE HCL 10 MG PO CAPS: ORAL_CAPSULE | ORAL | 0 refills | Status: DC

## 2017-05-25 MED ORDER — LUBIPROSTONE 24 MCG PO CAPS: 24.0000 ug | ORAL_CAPSULE | Freq: Two times a day (BID) | ORAL | 0 refills | Status: DC

## 2017-06-07 ENCOUNTER — Telehealth: Payer: Self-pay | Admitting: Radiology

## 2017-06-07 NOTE — Telephone Encounter (Signed)
Left message for patient to call back to reschedule appointment.

## 2017-06-10 ENCOUNTER — Telehealth: Payer: Self-pay

## 2017-06-10 ENCOUNTER — Other Ambulatory Visit: Payer: Self-pay

## 2017-06-10 MED ORDER — LEVOTHYROXINE SODIUM 125 MCG PO TABS: 125.0000 ug | ORAL_TABLET | Freq: Every day | ORAL | 2 refills | Status: DC

## 2017-06-10 MED ORDER — LUBIPROSTONE 24 MCG PO CAPS: 24.0000 ug | ORAL_CAPSULE | Freq: Two times a day (BID) | ORAL | 3 refills | Status: DC

## 2017-06-10 NOTE — Telephone Encounter (Signed)
Done

## 2017-06-10 NOTE — Addendum Note (Signed)
Addended by: Gelene MinkBOONE, Cherril Hett W on: 06/10/2017 11:40 AM   Modules accepted: Orders

## 2017-06-10 NOTE — Telephone Encounter (Signed)
Received a fax refill request for Amitiza 24 MCG #60 take 1 capsule po bid daily with meals. Last ov was 10/29/16, next apt is 07/15/2017.

## 2017-06-16 ENCOUNTER — Other Ambulatory Visit: Payer: Self-pay | Admitting: *Deleted

## 2017-06-16 MED ORDER — DICYCLOMINE HCL 10 MG PO CAPS: ORAL_CAPSULE | ORAL | 3 refills | Status: DC

## 2017-06-16 NOTE — Telephone Encounter (Signed)
[facility] for refill request for amitiza and dicyclomine

## 2017-06-20 ENCOUNTER — Other Ambulatory Visit: Payer: Self-pay

## 2017-06-20 MED ORDER — LUBIPROSTONE 24 MCG PO CAPS: 24.0000 ug | ORAL_CAPSULE | Freq: Two times a day (BID) | ORAL | 5 refills | Status: AC

## 2017-06-29 ENCOUNTER — Ambulatory Visit (INDEPENDENT_AMBULATORY_CARE_PROVIDER_SITE_OTHER): Payer: 59 | Admitting: Orthopaedic Surgery

## 2017-06-29 ENCOUNTER — Encounter: Payer: Self-pay | Admitting: Orthopaedic Surgery

## 2017-06-29 DIAGNOSIS — M25562 Pain in left knee: Secondary | ICD-10-CM | POA: Diagnosis not present

## 2017-06-29 DIAGNOSIS — M25561 Pain in right knee: Secondary | ICD-10-CM | POA: Diagnosis not present

## 2017-06-29 DIAGNOSIS — G8929 Other chronic pain: Secondary | ICD-10-CM | POA: Diagnosis not present

## 2017-06-29 NOTE — Progress Notes (Signed)
CC: Both of my knees are hurting. I would like an injection in both knees.  The patient has had chronic pain and tenderness of both knees for some time.  Injections help.  There is no locking or giving way of the knee.  There is no new trauma. There is no redness or signs of infections.  The knees have a mild effusion and some crepitus.  There is no redness or signs of recent trauma.  Right knee ROM is 0-100 and left knee ROM is 0-105.  Impression:  Chronic pain of the both knees  Return:  6 weeks  PROCEDURE NOTE:  The patient requests injections of both knees, verbal consent was obtained.  The left and right knee were individually prepped appropriately after time out was performed.   Sterile technique was observed and injection of 1 cc of Depo-Medrol 40 mg with several cc's of plain xylocaine. Anesthesia was provided by ethyl chloride and a 20-gauge needle was used to inject each knee area. The injections were tolerated well.  A band aid dressing was applied.  The patient was advised to apply ice later today and tomorrow to the injection sight as needed.  She wants to consider total knee surgery.  She is on a blood thinner and will need medical clearance.  Return in six weeks.  Electronically Signed Darreld McleanWayne Willer [place], MD 1/2/20192:39 PM

## 2017-07-05 ENCOUNTER — Other Ambulatory Visit: Payer: Self-pay | Admitting: "Endocrinology

## 2017-07-06 LAB — RENAL FUNCTION PANEL
ALBUMIN: 4.2 g/dL (ref 3.6–4.8)
BUN/Creatinine Ratio: 12 (ref 12–28)
BUN: 12 mg/dL (ref 8–27)
CO2: 19 mmol/L — ABNORMAL LOW (ref 20–29)
CREATININE: 0.98 mg/dL (ref 0.57–1.00)
Calcium: 9.4 mg/dL (ref 8.7–10.3)
Chloride: 103 mmol/L (ref 96–106)
GFR calc Af Amer: 68 mL/min/{1.73_m2} (ref >59)
GFR, EST NON AFRICAN AMERICAN: 59 mL/min/{1.73_m2} — AB (ref >59)
GLUCOSE: 206 mg/dL — AB (ref 65–99)
Phosphorus: 3.6 mg/dL (ref 2.5–4.5)
Potassium: 4.5 mmol/L (ref 3.5–5.2)
Sodium: 137 mmol/L (ref 134–144)

## 2017-07-06 LAB — HGB A1C W/O EAG: Hgb A1c MFr Bld: 10 % — ABNORMAL HIGH (ref 4.8–5.6)

## 2017-07-13 ENCOUNTER — Encounter: Payer: Self-pay | Admitting: "Endocrinology

## 2017-07-13 ENCOUNTER — Ambulatory Visit (INDEPENDENT_AMBULATORY_CARE_PROVIDER_SITE_OTHER): Payer: 59 | Admitting: "Endocrinology

## 2017-07-13 VITALS — BP 100/67 | HR 62 | Ht 61.5 in | Wt 215.0 lb

## 2017-07-13 DIAGNOSIS — E038 Other specified hypothyroidism: Secondary | ICD-10-CM

## 2017-07-13 DIAGNOSIS — Z91199 Patient's noncompliance with other medical treatment and regimen due to unspecified reason: Secondary | ICD-10-CM

## 2017-07-13 DIAGNOSIS — E1159 Type 2 diabetes mellitus with other circulatory complications: Secondary | ICD-10-CM | POA: Diagnosis not present

## 2017-07-13 DIAGNOSIS — E782 Mixed hyperlipidemia: Secondary | ICD-10-CM | POA: Diagnosis not present

## 2017-07-13 DIAGNOSIS — I10 Essential (primary) hypertension: Secondary | ICD-10-CM

## 2017-07-13 DIAGNOSIS — Z9119 Patient's noncompliance with other medical treatment and regimen: Secondary | ICD-10-CM

## 2017-07-13 MED ORDER — LANCETS 30G MISC: 2 refills | Status: AC

## 2017-07-13 MED ORDER — LANCETS ULTRA THIN MISC: 1.0000 | Freq: Three times a day (TID) | 5 refills | Status: AC

## 2017-07-13 MED ORDER — SITAGLIPTIN PHOSPHATE 100 MG PO TABS: 100.0000 mg | ORAL_TABLET | Freq: Every day | ORAL | 2 refills | Status: AC

## 2017-07-13 MED ORDER — GLUCOSE BLOOD VI STRP: 1.0000 | ORAL_STRIP | Freq: Three times a day (TID) | 5 refills | Status: AC

## 2017-07-13 MED ORDER — RELION ALL-IN-ONE DEVI: 1.0000 | Freq: Every day | 0 refills | Status: DC

## 2017-07-13 MED ORDER — GLUCOSE BLOOD VI STRP: ORAL_STRIP | 12 refills | Status: DC

## 2017-07-13 NOTE — Progress Notes (Signed)
Subjective:    Patient ID: Karen Mueller, female    DOB: [birth year],    Past Medical History:  Diagnosis Date  . Arthritis   . Asthma   . Chronic back pain   . Depression with anxiety   . Diabetes (HCC)   . GERD (gastroesophageal reflux disease)   . Heart disease   . HIV (human immunodeficiency virus infection) (HCC)   . Hypercholesterolemia   . Hypertension   . Hypothyroidism   . Schizophrenia (HCC)   . Sleep apnea   . Spinal stenosis    Past Surgical History:  Procedure Laterality Date  . BIOPSY N/A 01/16/2015   Procedure: BIOPSY;  Surgeon: Corbin Adeobert M Rourk, MD;  Location: [facility];  Service: Endoscopy;  Laterality: N/A;  Gastric, Ascending, Descending/Sigmoid  . COLONOSCOPY WITH PROPOFOL N/A 01/16/2015   RMR: internal hemorrhoids/left-side diverticulosis, segmental biopsies negative  . CORONARY ANGIOPLASTY WITH STENT PLACEMENT     X 2  . ESOPHAGOGASTRODUODENOSCOPY (EGD) WITH PROPOFOL N/A 01/16/2015   JWJ:XBJYNWGRMR:probale cervical esophageal with s/p dilation/HH. gastric erosions, benign and no h.pylori  . HERNIA REPAIR     umbilical  . MALONEY DILATION N/A 01/16/2015   Procedure: Elease HashimotoMALONEY DILATION;  Surgeon: Corbin Adeobert M Rourk, MD;  Location: [facility];  Service: Endoscopy;  Laterality: N/A;  56/  . TUBAL LIGATION     Social History   Socioeconomic History  . Marital status: Single    Spouse name: None  . Number of children: None  . Years of education: None  . Highest education level: None  Social Needs  . Financial resource strain: None  . Food insecurity - worry: None  . Food insecurity - inability: None  . Transportation needs - medical: None  . Transportation needs - non-medical: None  Occupational History  . None  Tobacco Use  . Smoking status: Never Smoker  . Smokeless tobacco: Never Used  . Tobacco comment: Never smoked  Substance and Sexual Activity  . Alcohol use: No    Alcohol/week: 0.0 oz  . Drug use: No  . Sexual activity: No    Birth control/protection: None   Other Topics Concern  . None  Social History Narrative  . None   Outpatient Encounter Medications as of 07/13/2017  Medication Sig  . glucose blood test strip 1 each by Other route 3 (three) times daily. Use as instructed 3 x daily. Ultra TRAK E11.65  . LANCETS ULTRA THIN MISC 1 each by Does not apply route 3 (three) times daily. Test TID. E11.65  . [DISCONTINUED] glucose blood test strip 1 each by Other route 3 (three) times daily. Use as instructed 3 x daily. Ultra TRAK E11.65  . [DISCONTINUED] LANCETS ULTRA THIN MISC 1 each by Does not apply route 3 (three) times daily. Test TID. E11.65  . aspirin 81 MG tablet Take 81 mg by mouth daily.  . benztropine (COGENTIN) 1 MG tablet 1 mg  . bictegravir-emtricitabine-tenofovir AF (BIKTARVY) 50-200-25 MG TABS tablet Take by mouth daily.  . Blood Gluc Meter Disp-Strips (RELION ALL-IN-ONE) DEVI 1 Piece by Does not apply route daily.  . clopidogrel (PLAVIX) 75 MG tablet Take 75 mg by mouth daily.  Marland Kitchen. dexlansoprazole (DEXILANT) 60 MG capsule Take by mouth.  . dicyclomine (BENTYL) 10 MG capsule TAKE 1 CAPSULE(10 MG) BY MOUTH FOUR TIMES DAILY BEFORE MEALS AND AT BEDTIME as needed for cramps/diarrhea  . docusate sodium (COLACE) 100 MG capsule Take 1 capsule (100 mg total) by mouth 2 (two) times daily.  .Marland Kitchen  ergocalciferol (VITAMIN D2) 50000 UNITS capsule Take 50,000 Units by mouth once a week.  . gabapentin (NEURONTIN) 600 MG tablet Take 600 mg by mouth at bedtime.  Marland Kitchen glucose blood (RELION GLUCOSE TEST STRIPS) test strip Use to test blood glucose 4 times a day.  Marland Kitchen glucose blood test strip 1 each as needed by Other route. Use as instructed bid. E11.65. Ultra Trak Test strips  . hydrOXYzine (VISTARIL) 50 MG capsule 3 (three) times daily before meals. 50 mg  . Lancets 30G MISC Use to prick her fingers 4 times a day.  . levothyroxine (SYNTHROID, LEVOTHROID) 125 MCG tablet Take 1 tablet (125 mcg total) by mouth daily before breakfast.  . lubiprostone (AMITIZA) 24  MCG capsule Take 1 capsule (24 mcg total) by mouth 2 (two) times daily with a meal.  . metoprolol tartrate (LOPRESSOR) 25 MG tablet Take 50 mg by mouth 2 (two) times daily.   . mirabegron ER (MYRBETRIQ) 50 MG TB24 tablet Take 50 mg by mouth daily.  . montelukast (SINGULAIR) 10 MG tablet Take 10 mg by mouth at bedtime.  . Multiple Vitamins-Minerals (CERTA PLUS) TABS Take 1 tablet by mouth daily.  . Omega-3 Fatty Acids (OMEGA-3 FISH OIL PO) Take by mouth 2 (two) times daily.  . potassium chloride (K-DUR,KLOR-CON) 10 MEQ tablet Take 10 mEq by mouth 2 (two) times daily.  . pravastatin (PRAVACHOL) 40 MG tablet Take 40 mg by mouth daily.  . QUEtiapine (SEROQUEL) 300 MG tablet Take 600 mg by mouth at bedtime.  . sertraline (ZOLOFT) 25 MG tablet 50 mg daily.   . sitaGLIPtin (JANUVIA) 100 MG tablet Take 1 tablet (100 mg total) by mouth daily.  Marland Kitchen tiZANidine (ZANAFLEX) 4 MG tablet Take 4 mg by mouth daily.   . valsartan (DIOVAN) 320 MG tablet Take 160 mg by mouth daily.   . [DISCONTINUED] canagliflozin (INVOKANA) 100 MG TABS tablet Take 1 tablet (100 mg total) daily before breakfast by mouth.  . [DISCONTINUED] sitaGLIPtin (JANUVIA) 50 MG tablet Take 100 mg by mouth daily.    No facility-administered encounter medications on file as of 07/13/2017.    ALLERGIES: Allergies  Allergen Reactions  . Metronidazole Other (See Comments)    And itching  . Sulfa Antibiotics Other (See Comments)    And itching  . Benadryl [Diphenhydramine Hcl] Hives  . Other Other (See Comments)    Other Reaction: Allergy; cauliflower   VACCINATION STATUS:  There is no immunization history on file for this patient.  Diabetes  She presents for her follow-up diabetic visit. She has type 2 diabetes mellitus. Her disease course has been worsening. Pertinent negatives for hypoglycemia include no confusion, headaches, pallor or seizures. Associated symptoms include fatigue, polydipsia and polyuria. Pertinent negatives for  diabetes include no chest pain and no polyphagia. Symptoms are worsening. Diabetic complications include peripheral neuropathy and PVD. Risk factors for coronary artery disease include diabetes mellitus, dyslipidemia, hypertension, obesity and sedentary lifestyle. Current diabetic treatment includes insulin injections and oral agent (monotherapy) (She is unreliable to use insulin. She reports that she injects insulin at random. She did not monitor blood glucose in the last 3 weeks at least. She did not bring her meter nor logs to review.). She is compliant with treatment some of the time. Her weight is stable. She is following a generally unhealthy diet. When asked about meal planning, she reported none. She has had a previous visit with a dietitian. She never participates in exercise. There is no change (She did not monitor  blood glucose, her A1c is 8.5% improving from  9.1%.) in her home blood glucose trend. (Mrs. Kantor is returning with her caseworker who is just starting to work with her. She brought a meter which has normal readings in it. Her A1c is higher at 10%. ) An ACE inhibitor/angiotensin II receptor blocker is being taken.  Hyperlipidemia  This is a chronic problem. The current episode started more than 1 year ago. Exacerbating diseases include diabetes and obesity. Pertinent negatives include no chest pain, myalgias or shortness of breath. Risk factors for coronary artery disease include dyslipidemia, diabetes mellitus and hypertension.  Hypertension  This is a chronic problem. The current episode started more than 1 year ago. Pertinent negatives include no chest pain, headaches, palpitations or shortness of breath. Risk factors for coronary artery disease include dyslipidemia, diabetes mellitus and sedentary lifestyle. Past treatments include angiotensin blockers. Hypertensive end-organ damage includes CAD/MI and PVD.    Review of Systems  Constitutional: Positive for fatigue. Negative for  unexpected weight change.  HENT: Negative for trouble swallowing and voice change.   Eyes: Negative for visual disturbance.  Respiratory: Negative for cough, shortness of breath and wheezing.   Cardiovascular: Negative for chest pain, palpitations and leg swelling.  Gastrointestinal: Negative for diarrhea, nausea and vomiting.  Endocrine: Positive for polydipsia and polyuria. Negative for cold intolerance, heat intolerance and polyphagia.  Musculoskeletal: Positive for gait problem. Negative for arthralgias and myalgias.       She is using a walker to get around.  Skin: Negative for color change, pallor, rash and wound.  Neurological: Negative for seizures and headaches.  Psychiatric/Behavioral: Negative for confusion and suicidal ideas.    Objective:    There were no vitals taken for this visit.  Wt Readings from Last 3 Encounters:  02/08/17 215 lb (97.5 kg)  02/08/17 215 lb (97.5 kg)  01/18/17 207 lb (93.9 kg)    Physical Exam  Constitutional: She is oriented to person, place, and time. She appears well-developed.  HENT:  Head: Normocephalic and atraumatic.  Eyes: EOM are normal.  Neck: Normal range of motion. Neck supple. No tracheal deviation present. No thyromegaly present.  Cardiovascular: Normal rate and regular rhythm.  Pulmonary/Chest: Effort normal and breath sounds normal.  Abdominal: Soft. Bowel sounds are normal. There is no tenderness. There is no guarding.  Musculoskeletal: She exhibits no edema.  She is using a walker to get around. She is out of the wheelchair that she used during last visit.  Neurological: She is alert and oriented to person, place, and time. She has normal reflexes. No cranial nerve deficit. Coordination normal.  Skin: Skin is warm and dry. No rash noted. No erythema. No pallor.  Psychiatric: She has a normal mood and affect. Judgment normal.  Reluctant affect.    Results for orders placed or performed in visit on 07/05/17  Renal function  panel  Result Value Ref Range   Glucose 206 (H) 65 - 99 mg/dL   BUN 12 8 - 27 mg/dL   Creatinine, Ser 1.61 0.57 - 1.00 mg/dL   GFR calc non Af Amer 59 (L) >59 mL/min/1.73   GFR calc Af Amer 68 >59 mL/min/1.73   BUN/Creatinine Ratio 12 12 - 28   Sodium 137 134 - 144 mmol/L   Potassium 4.5 3.5 - 5.2 mmol/L   Chloride 103 96 - 106 mmol/L   CO2 19 (L) 20 - 29 mmol/L   Calcium 9.4 8.7 - 10.3 mg/dL   Phosphorus 3.6 2.5 -  4.5 mg/dL   Albumin 4.2 3.6 - 4.8 g/dL  Hgb Z6X w/o eAG  Result Value Ref Range   Hgb A1c MFr Bld 10.0 (H) 4.8 - 5.6 %   Complete Blood Count (Most recent): Lab Results  Component Value Date   WBC 4.2 06/22/2016   HGB 13.8 06/22/2016   HCT 41.4 06/22/2016   MCV 87 06/22/2016   PLT 283 06/22/2016   Chemistry (most recent): Lab Results  Component Value Date   NA 137 07/05/2017   K 4.5 07/05/2017   CL 103 07/05/2017   CO2 19 (L) 07/05/2017   BUN 12 07/05/2017   CREATININE 0.98 07/05/2017   Diabetic Labs (most recent): Lab Results  Component Value Date   HGBA1C 10.0 (H) 07/05/2017   HGBA1C 8.3 01/19/2017   HGBA1C 8.5 08/03/2016   Assessment & Plan:   1. Type 2 diabetes mellitus with vascular disease (HCC)  Her diabetes is  complicated by coronary artery disease. Patient came without any meter nor logs. -Her recent repeat A1c is 10% increasing from 8.5%.  -  Recent labs reviewed. - Patient remains at a high risk for more acute and chronic complications of diabetes which include CAD, CVA, CKD, retinopathy, and neuropathy. These are all discussed in detail with the patient.  - I have re-counseled the patient on diet management and weight loss  by adopting a carbohydrate restricted / protein rich  Diet. - Patient is advised to stick to a routine mealtimes to eat 3 meals  a day and avoid unnecessary snacks ( to snack only to correct hypoglycemia).  -  Suggestion is made for her to avoid simple carbohydrates  from her diet including Cakes, Sweet Desserts /  Pastries, Ice Cream, Soda (diet and regular), Sweet Tea, Candies, Chips, Cookies, Store Bought Juices, Alcohol in Excess of  1-2 drinks a day, Artificial Sweeteners, and "Sugar-free" Products. This will help patient to have stable blood glucose profile and potentially avoid unintended weight gain.  - I have approached patient with the following individualized plan to manage diabetes and patient agrees.  -  The #1 goal in her therapy will be avoiding hypoglycemia.  - Given her significant social problem. Patient is struggling to follow monitoring and was overwhelmed with insulin administration instructions. - She is not reliable for safe use of insulin. - Discussed this concern with her caseworker. She only has 2 good options including placement in group home or regular nursing aid at home 24/7.  - she will need  assistance with her monitoring blood glucose and administering insulin to control diabetes to target by a skilled personnel . - In preparation for that, I advised for her to start monitoring blood glucose strictly 4 times a day-before meals and at bedtime and return in one week with her meter and logs.  - In the meantime, I will discontinue  Invokana , increase the dose of Januvia to 100 mg by mouth daily with breakfast.  -Patient is encouraged to call clinic for blood glucose levels less than 70 or above 300 mg /dl.  -She does not tolerate metformin, she has CKD . -  On prior encounters, she emphasized the fact that she does not want to go to group home or nursing home.  - Patient specific target  for A1c; LDL, HDL, Triglycerides, and  Waist Circumference were discussed in detail.  2) BP/HTN:  her blood pressure is controlled to target. Continue current medications including ARB.  3) Lipids/HPL: continue statins. 4)  Weight/Diet:  CDE consult in progress, exercise, and carbohydrates information provided.  5) hypothyroidism:    - I advised her caseworker to coordinate her  medications to include levothyroxine at 125 g by mouth every morning.   - We discussed about correct intake of levothyroxine, at fasting, with water, separated by at least 30 minutes from breakfast, and separated by more than 4 hours from calcium, iron, multivitamins, acid reflux medications (PPIs). -Patient is made aware of the fact that thyroid hormone replacement is needed for life, dose to be adjusted by periodic monitoring of thyroid function tests.  6) Chronic Care/Health Maintenance:  -Patient is ARB and Statin medications and encouraged to continue to follow up with Ophthalmology, Podiatrist at least yearly or according to recommendations, and advised to  stay away from smoking. I have recommended yearly flu vaccine and pneumonia vaccination at least every 5 years;  and  sleep for at least 7 hours a day.   I advised patient to maintain close follow up with their PCP for primary care needs.  - Time spent with the patient: 25 min, of which >50% was spent in reviewing her  current and  previous labs, previous treatments, and medications  doses and developing a plan for long-term care.   Follow up plan: Return in about 1 week (around 07/20/2017) for follow up with meter and logs- no labs.  Marquis Lunch, MD Phone: [phone]  Fax: [phone]   07/13/2017, 4:44 PM

## 2017-07-15 ENCOUNTER — Encounter: Payer: Self-pay | Admitting: Gastroenterology

## 2017-07-15 ENCOUNTER — Ambulatory Visit: Payer: 59 | Admitting: Gastroenterology

## 2017-07-15 ENCOUNTER — Telehealth: Payer: Self-pay | Admitting: Internal Medicine

## 2017-07-15 NOTE — Telephone Encounter (Signed)
Patient was a no show and letter sent  °

## 2017-07-27 ENCOUNTER — Ambulatory Visit (INDEPENDENT_AMBULATORY_CARE_PROVIDER_SITE_OTHER): Payer: 59 | Admitting: "Endocrinology

## 2017-07-27 ENCOUNTER — Encounter: Payer: Self-pay | Admitting: "Endocrinology

## 2017-07-27 VITALS — BP 124/78 | HR 72 | Ht 61.5 in | Wt 228.0 lb

## 2017-07-27 DIAGNOSIS — E1159 Type 2 diabetes mellitus with other circulatory complications: Secondary | ICD-10-CM

## 2017-07-27 DIAGNOSIS — I10 Essential (primary) hypertension: Secondary | ICD-10-CM | POA: Diagnosis not present

## 2017-07-27 DIAGNOSIS — E038 Other specified hypothyroidism: Secondary | ICD-10-CM

## 2017-07-27 DIAGNOSIS — E782 Mixed hyperlipidemia: Secondary | ICD-10-CM | POA: Diagnosis not present

## 2017-07-27 MED ORDER — INSULIN GLARGINE 100 UNIT/ML SOLOSTAR PEN: 20.0000 [IU] | PEN_INJECTOR | Freq: Every day | SUBCUTANEOUS | 2 refills | Status: DC

## 2017-07-27 MED ORDER — INSULIN PEN NEEDLE 31G X 8 MM MISC: 1.0000 | 3 refills | Status: DC

## 2017-07-27 MED ORDER — GLUCOSE BLOOD VI STRP: ORAL_STRIP | 12 refills | Status: AC

## 2017-07-27 NOTE — Patient Instructions (Signed)

## 2017-07-27 NOTE — Progress Notes (Signed)
Subjective:    Patient ID: Karen Mueller, female    DOB: February 18, 1946,    Past Medical History:  Diagnosis Date  . Arthritis   . Asthma   . Chronic back pain   . Depression with anxiety   . Diabetes (HCC)   . GERD (gastroesophageal reflux disease)   . Heart disease   . HIV (human immunodeficiency virus infection) (HCC)   . Hypercholesterolemia   . Hypertension   . Hypothyroidism   . Schizophrenia (HCC)   . Sleep apnea   . Spinal stenosis    Past Surgical History:  Procedure Laterality Date  . BIOPSY N/A 01/16/2015   Procedure: BIOPSY;  Surgeon: Corbin Ade, MD;  Location: [facility];  Service: Endoscopy;  Laterality: N/A;  Gastric, Ascending, Descending/Sigmoid  . COLONOSCOPY WITH PROPOFOL N/A 01/16/2015   RMR: internal hemorrhoids/left-side diverticulosis, segmental biopsies negative  . CORONARY ANGIOPLASTY WITH STENT PLACEMENT     X 2  . ESOPHAGOGASTRODUODENOSCOPY (EGD) WITH PROPOFOL N/A 01/16/2015   ZOX:[id number] cervical esophageal with s/p dilation/HH. gastric erosions, benign and no h.pylori  . HERNIA REPAIR     umbilical  . MALONEY DILATION N/A 01/16/2015   Procedure: [name] DILATION;  Surgeon: Corbin Ade, MD;  Location: [facility];  Service: Endoscopy;  Laterality: N/A;  56/  . TUBAL LIGATION     Social History   Socioeconomic History  . Marital status: Single    Spouse name: None  . Number of children: None  . Years of education: None  . Highest education level: None  Social Needs  . Financial resource strain: None  . Food insecurity - worry: None  . Food insecurity - inability: None  . Transportation needs - medical: None  . Transportation needs - non-medical: None  Occupational History  . None  Tobacco Use  . Smoking status: Never Smoker  . Smokeless tobacco: Never Used  . Tobacco comment: Never smoked  Substance and Sexual Activity  . Alcohol use: No    Alcohol/week: 0.0 oz  . Drug use: No  . Sexual activity: No    Birth control/protection: None   Other Topics Concern  . None  Social History Narrative  . None   Outpatient Encounter Medications as of 07/27/2017  Medication Sig  . aspirin 81 MG tablet Take 81 mg by mouth daily.  . benztropine (COGENTIN) 1 MG tablet 1 mg  . bictegravir-emtricitabine-tenofovir AF (BIKTARVY) 50-200-25 MG TABS tablet Take by mouth daily.  . clopidogrel (PLAVIX) 75 MG tablet Take 75 mg by mouth daily.  Marland Kitchen dexlansoprazole (DEXILANT) 60 MG capsule Take by mouth.  . dicyclomine (BENTYL) 10 MG capsule TAKE 1 CAPSULE(10 MG) BY MOUTH FOUR TIMES DAILY BEFORE MEALS AND AT BEDTIME as needed for cramps/diarrhea  . docusate sodium (COLACE) 100 MG capsule Take 1 capsule (100 mg total) by mouth 2 (two) times daily.  . ergocalciferol (VITAMIN D2) 50000 UNITS capsule Take 50,000 Units by mouth once a week.  . gabapentin (NEURONTIN) 600 MG tablet Take 600 mg by mouth at bedtime.  Marland Kitchen glucose blood (ULTRATRAK PRO TEST) test strip Use as instructed  . glucose blood test strip 1 each by Other route 3 (three) times daily. Use as instructed 3 x daily. Ultra TRAK E11.65  . hydrOXYzine (VISTARIL) 50 MG capsule 3 (three) times daily before meals. 50 mg  . Lancets 30G MISC Use to prick her fingers 4 times a day.  Marland Kitchen LANCETS ULTRA THIN MISC 1 each by Does not apply route  3 (three) times daily. Test TID. E11.65  . levothyroxine (SYNTHROID, LEVOTHROID) 125 MCG tablet Take 1 tablet (125 mcg total) by mouth daily before breakfast.  . lubiprostone (AMITIZA) 24 MCG capsule Take 1 capsule (24 mcg total) by mouth 2 (two) times daily with a meal.  . metoprolol tartrate (LOPRESSOR) 25 MG tablet Take 50 mg by mouth 2 (two) times daily.   . mirabegron ER (MYRBETRIQ) 50 MG TB24 tablet Take 50 mg by mouth daily.  . montelukast (SINGULAIR) 10 MG tablet Take 10 mg by mouth at bedtime.  . Multiple Vitamins-Minerals (CERTA PLUS) TABS Take 1 tablet by mouth daily.  . Omega-3 Fatty Acids (OMEGA-3 FISH OIL PO) Take by mouth 2 (two) times daily.  .  potassium chloride (K-DUR,KLOR-CON) 10 MEQ tablet Take 10 mEq by mouth 2 (two) times daily.  . pravastatin (PRAVACHOL) 40 MG tablet Take 40 mg by mouth daily.  . QUEtiapine (SEROQUEL) 300 MG tablet Take 600 mg by mouth at bedtime.  . sertraline (ZOLOFT) 25 MG tablet 50 mg daily.   . sitaGLIPtin (JANUVIA) 100 MG tablet Take 1 tablet (100 mg total) by mouth daily.  Marland Kitchen tiZANidine (ZANAFLEX) 4 MG tablet Take 4 mg by mouth daily.   . valsartan (DIOVAN) 320 MG tablet Take 160 mg by mouth daily.   . [DISCONTINUED] Blood Gluc Meter Disp-Strips (RELION ALL-IN-ONE) DEVI 1 Piece by Does not apply route daily.  . [DISCONTINUED] glucose blood (RELION GLUCOSE TEST STRIPS) test strip Use to test blood glucose 4 times a day.  . [DISCONTINUED] glucose blood test strip 1 each as needed by Other route. Use as instructed bid. E11.65. Ultra Trak Test strips  . [DISCONTINUED] Insulin Glargine (LANTUS SOLOSTAR) 100 UNIT/ML Solostar Pen Inject 20 Units into the skin daily with breakfast.  . [DISCONTINUED] Insulin Pen Needle (B-D ULTRAFINE III SHORT PEN) 31G X 8 MM MISC 1 each by Does not apply route as directed.   No facility-administered encounter medications on file as of 07/27/2017.    ALLERGIES: Allergies  Allergen Reactions  . Metronidazole Other (See Comments)    And itching  . Sulfa Antibiotics Other (See Comments)    And itching  . Benadryl [Diphenhydramine Hcl] Hives  . Other Other (See Comments)    Other Reaction: Allergy; cauliflower   VACCINATION STATUS:  There is no immunization history on file for this patient.  Diabetes  She presents for her follow-up diabetic visit. She has type 2 diabetes mellitus. Her disease course has been improving. Pertinent negatives for hypoglycemia include no confusion, headaches, pallor or seizures. Associated symptoms include fatigue, polydipsia and polyuria. Pertinent negatives for diabetes include no chest pain and no polyphagia. Symptoms are improving. Diabetic  complications include peripheral neuropathy and PVD. Risk factors for coronary artery disease include diabetes mellitus, dyslipidemia, hypertension, obesity and sedentary lifestyle. Current diabetic treatment includes insulin injections and oral agent (monotherapy). Compliance with diabetes treatment: She is accompanied by her caseworker for the second time. Her weight is increasing steadily. She is following a generally unhealthy diet. When asked about meal planning, she reported none. She has had a previous visit with a dietitian. She never participates in exercise. There is no change in her home blood glucose trend. Her breakfast blood glucose range is generally 180-200 mg/dl. Her lunch blood glucose range is generally 180-200 mg/dl. Her dinner blood glucose range is generally 180-200 mg/dl. Her bedtime blood glucose range is generally 180-200 mg/dl. Her overall blood glucose range is 180-200 mg/dl. ( Her  recent A1c  is higher at 10%. ) An ACE inhibitor/angiotensin II receptor blocker is being taken.  Hyperlipidemia  This is a chronic problem. The current episode started more than 1 year ago. Exacerbating diseases include diabetes and obesity. Pertinent negatives include no chest pain, myalgias or shortness of breath. Risk factors for coronary artery disease include dyslipidemia, diabetes mellitus and hypertension.  Hypertension  This is a chronic problem. The current episode started more than 1 year ago. Pertinent negatives include no chest pain, headaches, palpitations or shortness of breath. Risk factors for coronary artery disease include dyslipidemia, diabetes mellitus and sedentary lifestyle. Past treatments include angiotensin blockers. Hypertensive end-organ damage includes CAD/MI and PVD.    Review of Systems  Constitutional: Positive for fatigue. Negative for unexpected weight change.  HENT: Negative for trouble swallowing and voice change.   Eyes: Negative for visual disturbance.   Respiratory: Negative for cough, shortness of breath and wheezing.   Cardiovascular: Negative for chest pain, palpitations and leg swelling.  Gastrointestinal: Negative for diarrhea, nausea and vomiting.  Endocrine: Positive for polydipsia and polyuria. Negative for cold intolerance, heat intolerance and polyphagia.  Musculoskeletal: Positive for gait problem. Negative for arthralgias and myalgias.       She is using a walker to get around.  Skin: Negative for color change, pallor, rash and wound.  Neurological: Negative for seizures and headaches.  Psychiatric/Behavioral: Negative for confusion and suicidal ideas.    Objective:    BP 124/78   Pulse 72   Ht 5' 1.5" (1.562 m)   Wt 228 lb (103.4 kg)   BMI 42.38 kg/m   Wt Readings from Last 3 Encounters:  07/27/17 228 lb (103.4 kg)  07/13/17 215 lb (97.5 kg)  02/08/17 215 lb (97.5 kg)    Physical Exam  Constitutional: She is oriented to person, place, and time. She appears well-developed.  HENT:  Head: Normocephalic and atraumatic.  Eyes: EOM are normal.  Neck: Normal range of motion. Neck supple. No tracheal deviation present. No thyromegaly present.  Cardiovascular: Normal rate and regular rhythm.  Pulmonary/Chest: Effort normal and breath sounds normal.  Abdominal: Soft. Bowel sounds are normal. There is no tenderness. There is no guarding.  Musculoskeletal: She exhibits no edema.  She is using a walker to get around. She is out of the wheelchair that she used during last visit.  Neurological: She is alert and oriented to person, place, and time. She has normal reflexes. No cranial nerve deficit. Coordination normal.  Skin: Skin is warm and dry. No rash noted. No erythema. No pallor.  Psychiatric: She has a normal mood and affect. Judgment normal.  Reluctant affect.    Results for orders placed or performed in visit on 07/05/17  Renal function panel  Result Value Ref Range   Glucose 206 (H) 65 - 99 mg/dL   BUN 12 8 - 27  mg/dL   Creatinine, Ser 1.61 0.57 - 1.00 mg/dL   GFR calc non Af Amer 59 (L) >59 mL/min/1.73   GFR calc Af Amer 68 >59 mL/min/1.73   BUN/Creatinine Ratio 12 12 - 28   Sodium 137 134 - 144 mmol/L   Potassium 4.5 3.5 - 5.2 mmol/L   Chloride 103 96 - 106 mmol/L   CO2 19 (L) 20 - 29 mmol/L   Calcium 9.4 8.7 - 10.3 mg/dL   Phosphorus 3.6 2.5 - 4.5 mg/dL   Albumin 4.2 3.6 - 4.8 g/dL  Hgb W9U w/o eAG  Result Value Ref Range   Hgb A1c MFr  Bld 10.0 (H) 4.8 - 5.6 %   Complete Blood Count (Most recent): Lab Results  Component Value Date   WBC 4.2 06/22/2016   HGB 13.8 06/22/2016   HCT 41.4 06/22/2016   MCV 87 06/22/2016   PLT 283 06/22/2016   Chemistry (most recent): Lab Results  Component Value Date   NA 137 07/05/2017   K 4.5 07/05/2017   CL 103 07/05/2017   CO2 19 (L) 07/05/2017   BUN 12 07/05/2017   CREATININE 0.98 07/05/2017   Diabetic Labs (most recent): Lab Results  Component Value Date   HGBA1C 10.0 (H) 07/05/2017   HGBA1C 8.3 01/19/2017   HGBA1C 8.5 08/03/2016   Assessment & Plan:   1. Type 2 diabetes mellitus with vascular disease (HCC)  Her diabetes is  complicated by coronary artery disease. Patient came without any meter nor logs. -Her recent repeat A1c is 10% increasing from 8.5%.  -  Recent labs reviewed. -She came with her caseworker for the second time, her meter shows slightly improving blood glucose profile between 140 and 180 mg/dL, no reported or documented hypoglycemia. - Patient remains at a high risk for more acute and chronic complications of diabetes which include CAD, CVA, CKD, retinopathy, and neuropathy. These are all discussed in detail with the patient.  -She agrees to stick to the dietary plan recommended.  - Patient is advised to stick to a routine mealtimes to eat 3 meals  a day and avoid unnecessary snacks ( to snack only to correct hypoglycemia).  -  Suggestion is made for her to avoid simple carbohydrates  from her diet including  Cakes, Sweet Desserts / Pastries, Ice Cream, Soda (diet and regular), Sweet Tea, Candies, Chips, Cookies, Store Bought Juices, Alcohol in Excess of  1-2 drinks a day, Artificial Sweeteners, and "Sugar-free" Products. This will help patient to have stable blood glucose profile and potentially avoid unintended weight gain.   - I have approached patient with the following individualized plan to manage diabetes and patient agrees.  -  The #1 goal in her therapy will be avoiding hypoglycemia.  - Given her significant social problem, with no support available for her,  she is not reliable for safe use of insulin. -According to her Child psychotherapistsocial worker, this arrangement cannot be made at this time.  -It is reported that she would get assistance for monitoring blood glucose 2 times a day, and I have asked for them to call clinic for blood glucose readings above 300 mg/dL x3  -In the meantime, I will continue Januvia 100 mg p.o. daily with breakfast.    -She does not tolerate metformin, she has CKD ; she has sulfa allergy. -  On prior encounters, she emphasized the fact that she does not want to go to group home or nursing home.  - Patient specific target  for A1c; LDL, HDL, Triglycerides, and  Waist Circumference were discussed in detail.  2) BP/HTN:  her blood pressure is controlled to target. Continue current medications including ARB.  3) Lipids/HPL: continue statins. 4)  Weight/Diet: CDE consult in progress, exercise, and carbohydrates information provided.  5) hypothyroidism:    - I advised her caseworker to coordinate her medications to include levothyroxine at 125 g by mouth every morning.  - We discussed about correct intake of levothyroxine, at fasting, with water, separated by at least 30 minutes from breakfast, and separated by more than 4 hours from calcium, iron, multivitamins, acid reflux medications (PPIs). -Patient is made aware of the  fact that thyroid hormone replacement is needed for  life, dose to be adjusted by periodic monitoring of thyroid function tests.  Intact  6) Chronic Care/Health Maintenance:  -Patient is ARB and Statin medications and encouraged to continue to follow up with Ophthalmology, Podiatrist at least yearly or according to recommendations, and advised to  stay away from smoking. I have recommended yearly flu vaccine and pneumonia vaccination at least every 5 years;  and  sleep for at least 7 hours a day.   I advised patient to maintain close follow up with their PCP for primary care needs.  - Time spent with the patient: 25 min, of which >50% was spent in reviewing her blood glucose logs , discussing her hypo- and hyper-glycemic episodes, reviewing her current and  previous labs and insulin doses and developing a plan to avoid hypo- and hyper-glycemia. Please refer to Patient Instructions for Blood Glucose Monitoring and Insulin/Medications Dosing Guide"  in media tab for additional information.   Follow up plan: Return in about 3 months (around 10/25/2017) for follow up with meter and logs- no labs.  Marquis Lunch, MD Phone: [phone]  Fax: [phone]   07/27/2017, 5:24 PM

## 2017-08-10 ENCOUNTER — Ambulatory Visit (INDEPENDENT_AMBULATORY_CARE_PROVIDER_SITE_OTHER): Payer: 59 | Admitting: Orthopaedic Surgery

## 2017-08-10 ENCOUNTER — Encounter: Payer: Self-pay | Admitting: Orthopaedic Surgery

## 2017-08-10 DIAGNOSIS — M25562 Pain in left knee: Secondary | ICD-10-CM | POA: Diagnosis not present

## 2017-08-10 DIAGNOSIS — G8929 Other chronic pain: Secondary | ICD-10-CM | POA: Diagnosis not present

## 2017-08-10 DIAGNOSIS — M25561 Pain in right knee: Secondary | ICD-10-CM | POA: Diagnosis not present

## 2017-08-10 NOTE — Progress Notes (Signed)
CC: Both of my knees are hurting. I would like an injection in both knees.  The patient has had chronic pain and tenderness of both knees for some time.  Injections help.  There is no locking or giving way of the knee.  There is no new trauma. There is no redness or signs of infections.  The knees have a mild effusion and some crepitus.  There is no redness or signs of recent trauma.  Right knee ROM is 0-100 and left knee ROM is 0-105.  Impression:  Chronic pain of the both knees  Return:  6 weeks  PROCEDURE NOTE:  The patient requests injections of both knees, verbal consent was obtained.  The left and right knee were individually prepped appropriately after time out was performed.   Sterile technique was observed and injection of 1 cc of Depo-Medrol 40 mg with several cc's of plain xylocaine. Anesthesia was provided by ethyl chloride and a 20-gauge needle was used to inject each knee area. The injections were tolerated well.  A band aid dressing was applied.  The patient was advised to apply ice later today and tomorrow to the injection sight as needed.  She is still considering a possible total knee.  She needs medical clearance and evaluation.  Return in six weeks.  Electronically Signed Darreld McleanWayne Etana Beets, MD 2/13/20192:26 PM

## 2017-08-16 ENCOUNTER — Telehealth: Payer: Self-pay

## 2017-08-16 ENCOUNTER — Encounter: Payer: 59 | Attending: "Endocrinology | Admitting: Nutrition

## 2017-08-16 ENCOUNTER — Ambulatory Visit: Payer: 59 | Admitting: "Endocrinology

## 2017-08-16 ENCOUNTER — Encounter: Payer: Self-pay | Admitting: Nutrition

## 2017-08-16 ENCOUNTER — Ambulatory Visit: Payer: 59 | Admitting: Nutrition

## 2017-08-16 VITALS — Wt 209.0 lb

## 2017-08-16 DIAGNOSIS — Z713 Dietary counseling and surveillance: Secondary | ICD-10-CM | POA: Insufficient documentation

## 2017-08-16 DIAGNOSIS — E118 Type 2 diabetes mellitus with unspecified complications: Secondary | ICD-10-CM

## 2017-08-16 DIAGNOSIS — IMO0002 Reserved for concepts with insufficient information to code with codable children: Secondary | ICD-10-CM

## 2017-08-16 DIAGNOSIS — E1165 Type 2 diabetes mellitus with hyperglycemia: Secondary | ICD-10-CM | POA: Insufficient documentation

## 2017-08-16 NOTE — Patient Instructions (Addendum)
Goals 1 Follow Plate Method 2. Cut out sweets 3. Drink more water 4. Increase fresh fruits and vegetables Dispose of the insulin in the refrigerator and do not take any insulin

## 2017-08-16 NOTE — Telephone Encounter (Signed)
Pt is requesting a refill on Docusate Sodium 100 mg bid.

## 2017-08-16 NOTE — Progress Notes (Addendum)
Pt. Complained of dry mouth and feeling weak and sleepy. Checked her  BS and it was 330 mg/dl. Gave her a glass of water. She ate a bowl of cherrios and milk for breakfast and took her medications.   Medical Nutrition Therapy:  Appt start time: 1100  end time: 1130 1130Assessment:  Primary concerns today: Diabetes Type 2 DM.   She is here with Crissie Sickleson Pridgen, her case worker. Her case worker and Ladonna SnideShonda help prepare meals for her. She often times doesn't want to eat the healthier foods they provide. She tends to have snacks and sweet in the house that friends and family bring her. BS reported from case worker have been 200-300's.     Her case worker reports there is some insulin in her fridge and  Per Dr. Fransico HimNida, she is not safe to administer her own insulin. Agencies are unable to administer insulin. Advised to dispose of the current insuln in her fridge. He verbalized understanding.   Wt Readings from Last 3 Encounters:  07/27/17 228 lb (103.4 kg)  07/13/17 215 lb (97.5 kg)  02/08/17 215 lb (97.5 kg)   Ht Readings from Last 3 Encounters:  07/27/17 5' 1.5" (1.562 m)  07/13/17 5' 1.5" (1.562 m)  04/05/17 5' 1.5" (1.562 m)   There is no height or weight on file to calculate BMI.  Preferred Learning Style:   No preference indicated   Learning Readiness:   Ready  Change in progress  MEDICATIONS: See List   DIETARY INTAKE:  Breakfast:  Cherrios,Lunch:  Baked chicken, string beans, Sodas  Dinner:  Rice, and chicken, string beans, water  Estimated energy needs: 1200 calories 135 g carbohydrates 90 g protein 33 g fat  Progress Towards Goal(s):  In progress.   Nutritional Diagnosis:  NB-1.1 Food and nutrition-related knowledge deficit As related to Diabetes.  As evidenced by A1C 8.2%..   Intervention: Nutrition and Diabetes education provided on My Plate, CHO counting, meal planning, portion sizes, timing of meals, avoiding snacks between meals unless having a low blood sugar,  target ranges for A1C and blood sugars, signs/symptoms and treatment of hyper/hypoglycemia, monitoring blood sugars, taking medications as prescribed, benefits of exercising 30 minutes per day and prevention of complications of DM. Goals 1 Follow Plate Method 2. Cut out sweets 3. Drink more water 4. Increase fresh fruits and vegetables Dispose of the insulin in the refrigerator.  Goals Need to comply with your diet Drink more water 5-6 bottles per day.  Teaching Method Utilized: Visual Auditory Hands on  Handouts given during visit include:  The Plate Method  Meal Plan Card  Diabetes Instructions.  Barriers to learning/adherence to lifestyle change: Age, inability to remember to take meds on time. Unable to prepare own meals.  Demonstrated degree of understanding via:  Teach Back   Monitoring/Evaluation:  Dietary intake, exercise, meal planning, SBG, and body weight in 3 month(s).  She would benfit from nursing home or assisted living to better assist her with her medications, meals and mobility needs. However, she doesn't want to go to a facility. However, she needs to take insulin to improve her blood sugars and reduce complications from DM.

## 2017-08-17 MED ORDER — DOCUSATE SODIUM 100 MG PO CAPS: 100.0000 mg | ORAL_CAPSULE | Freq: Two times a day (BID) | ORAL | 3 refills | Status: DC

## 2017-08-17 NOTE — Telephone Encounter (Signed)
rx done

## 2017-09-08 ENCOUNTER — Ambulatory Visit: Payer: 59 | Admitting: Gastroenterology

## 2017-09-21 ENCOUNTER — Encounter: Payer: Self-pay | Admitting: Orthopedic Surgery

## 2017-09-21 ENCOUNTER — Ambulatory Visit: Payer: 59 | Admitting: Orthopedic Surgery

## 2017-10-06 ENCOUNTER — Other Ambulatory Visit: Payer: Self-pay

## 2017-10-06 MED ORDER — LEVOTHYROXINE SODIUM 125 MCG PO TABS: 125.0000 ug | ORAL_TABLET | Freq: Every day | ORAL | 2 refills | Status: AC

## 2017-10-11 LAB — HEMOGLOBIN A1C: Hemoglobin A1C: 9.6

## 2017-10-11 LAB — BASIC METABOLIC PANEL
BUN: 13 (ref 4–21)
Creatinine: 1.3 — AB (ref <=1.1)

## 2017-10-18 ENCOUNTER — Encounter: Payer: Self-pay | Admitting: "Endocrinology

## 2017-10-18 ENCOUNTER — Ambulatory Visit (INDEPENDENT_AMBULATORY_CARE_PROVIDER_SITE_OTHER): Payer: 59 | Admitting: "Endocrinology

## 2017-10-18 VITALS — BP 118/62 | HR 74 | Ht 61.5 in

## 2017-10-18 DIAGNOSIS — E038 Other specified hypothyroidism: Secondary | ICD-10-CM | POA: Diagnosis not present

## 2017-10-18 DIAGNOSIS — I10 Essential (primary) hypertension: Secondary | ICD-10-CM

## 2017-10-18 DIAGNOSIS — E1159 Type 2 diabetes mellitus with other circulatory complications: Secondary | ICD-10-CM

## 2017-10-18 DIAGNOSIS — E782 Mixed hyperlipidemia: Secondary | ICD-10-CM | POA: Diagnosis not present

## 2017-10-18 NOTE — Progress Notes (Signed)
Subjective:    Patient ID: Karen Mueller, female    DOB: January 15, 1946,    Past Medical History:  Diagnosis Date  . Arthritis   . Asthma   . Chronic back pain   . Depression with anxiety   . Diabetes (HCC)   . GERD (gastroesophageal reflux disease)   . Heart disease   . HIV (human immunodeficiency virus infection) (HCC)   . Hypercholesterolemia   . Hypertension   . Hypothyroidism   . Schizophrenia (HCC)   . Sleep apnea   . Spinal stenosis    Past Surgical History:  Procedure Laterality Date  . BIOPSY N/A 01/16/2015   Procedure: BIOPSY;  Surgeon: Corbin Ade, MD;  Location: [facility];  Service: Endoscopy;  Laterality: N/A;  Gastric, Ascending, Descending/Sigmoid  . COLONOSCOPY WITH PROPOFOL N/A 01/16/2015   RMR: internal hemorrhoids/left-side diverticulosis, segmental biopsies negative  . CORONARY ANGIOPLASTY WITH STENT PLACEMENT     X 2  . ESOPHAGOGASTRODUODENOSCOPY (EGD) WITH PROPOFOL N/A 01/16/2015   ZOX:[id number] cervical esophageal with s/p dilation/HH. gastric erosions, benign and no h.pylori  . HERNIA REPAIR     umbilical  . MALONEY DILATION N/A 01/16/2015   Procedure: [name] DILATION;  Surgeon: Corbin Ade, MD;  Location: [facility];  Service: Endoscopy;  Laterality: N/A;  56/  . TUBAL LIGATION     Social History   Socioeconomic History  . Marital status: Single    Spouse name: Not on file  . Number of children: Not on file  . Years of education: Not on file  . Highest education level: Not on file  Occupational History  . Not on file  Social Needs  . Financial resource strain: Not on file  . Food insecurity:    Worry: Not on file    Inability: Not on file  . Transportation needs:    Medical: Not on file    Non-medical: Not on file  Tobacco Use  . Smoking status: Never Smoker  . Smokeless tobacco: Never Used  . Tobacco comment: Never smoked  Substance and Sexual Activity  . Alcohol use: No    Alcohol/week: 0.0 oz  . Drug use: No  . Sexual activity:  Never    Birth control/protection: None  Lifestyle  . Physical activity:    Days per week: Not on file    Minutes per session: Not on file  . Stress: Not on file  Relationships  . Social connections:    Talks on phone: Not on file    Gets together: Not on file    Attends religious service: Not on file    Active member of club or organization: Not on file    Attends meetings of clubs or organizations: Not on file    Relationship status: Not on file  Other Topics Concern  . Not on file  Social History Narrative  . Not on file   Outpatient Encounter Medications as of 10/18/2017  Medication Sig  . docusate sodium (COLACE) 100 MG capsule Take 1 capsule (100 mg total) by mouth 2 (two) times daily.  . ergocalciferol (VITAMIN D2) 50000 UNITS capsule Take 50,000 Units by mouth once a week.  . hydrOXYzine (VISTARIL) 50 MG capsule 3 (three) times daily before meals. 50 mg  . levothyroxine (SYNTHROID, LEVOTHROID) 125 MCG tablet Take 1 tablet (125 mcg total) by mouth daily before breakfast.  . losartan (COZAAR) 100 MG tablet Take 100 mg by mouth daily.  . metoprolol tartrate (LOPRESSOR) 25 MG tablet Take  50 mg by mouth 2 (two) times daily.   . montelukast (SINGULAIR) 10 MG tablet Take 10 mg by mouth at bedtime.  . Multiple Vitamins-Minerals (CERTA PLUS) TABS Take 1 tablet by mouth daily.  . potassium chloride (K-DUR) 10 MEQ tablet Take 10 mEq by mouth daily.  . pravastatin (PRAVACHOL) 40 MG tablet Take 40 mg by mouth daily.  . QUEtiapine (SEROQUEL) 300 MG tablet Take 600 mg by mouth at bedtime.  . sertraline (ZOLOFT) 50 MG tablet 50 mg daily.   . sitaGLIPtin (JANUVIA) 100 MG tablet Take 1 tablet (100 mg total) by mouth daily.  Marland Kitchen. tiZANidine (ZANAFLEX) 4 MG tablet Take 4 mg by mouth daily.   . Vitamin D, Ergocalciferol, (DRISDOL) 50000 units CAPS capsule Take 50,000 Units by mouth every 7 (seven) days.  Marland Kitchen. aspirin 81 MG tablet Take 81 mg by mouth daily.  . benztropine (COGENTIN) 1 MG tablet 1 mg   . bictegravir-emtricitabine-tenofovir AF (BIKTARVY) 50-200-25 MG TABS tablet Take by mouth daily.  . clopidogrel (PLAVIX) 75 MG tablet Take 75 mg by mouth daily.  Marland Kitchen. dexlansoprazole (DEXILANT) 60 MG capsule Take by mouth.  . dicyclomine (BENTYL) 10 MG capsule TAKE 1 CAPSULE(10 MG) BY MOUTH FOUR TIMES DAILY BEFORE MEALS AND AT BEDTIME as needed for cramps/diarrhea  . gabapentin (NEURONTIN) 600 MG tablet Take 600 mg by mouth at bedtime.  Marland Kitchen. glucose blood (ULTRATRAK PRO TEST) test strip Use as instructed  . glucose blood test strip 1 each by Other route 3 (three) times daily. Use as instructed 3 x daily. Ultra TRAK E11.65  . Lancets 30G MISC Use to prick her fingers 4 times a day.  Marland Kitchen. LANCETS ULTRA THIN MISC 1 each by Does not apply route 3 (three) times daily. Test TID. E11.65  . lubiprostone (AMITIZA) 24 MCG capsule Take 1 capsule (24 mcg total) by mouth 2 (two) times daily with a meal.  . mirabegron ER (MYRBETRIQ) 50 MG TB24 tablet Take 50 mg by mouth daily.  . Omega-3 Fatty Acids (OMEGA-3 FISH OIL PO) Take by mouth 2 (two) times daily.  . potassium chloride (K-DUR,KLOR-CON) 10 MEQ tablet Take 10 mEq by mouth 2 (two) times daily.  . valsartan (DIOVAN) 320 MG tablet Take 160 mg by mouth daily.    No facility-administered encounter medications on file as of 10/18/2017.    ALLERGIES: Allergies  Allergen Reactions  . Metronidazole Other (See Comments)    And itching  . Sulfa Antibiotics Other (See Comments)    And itching  . Benadryl [Diphenhydramine Hcl] Hives  . Other Other (See Comments)    Other Reaction: Allergy; cauliflower   VACCINATION STATUS:  There is no immunization history on file for this patient.  Diabetes  She presents for her follow-up diabetic visit. She has type 2 diabetes mellitus. Her disease course has been worsening. Pertinent negatives for hypoglycemia include no confusion, headaches, pallor or seizures. Associated symptoms include fatigue, polydipsia and polyuria.  Pertinent negatives for diabetes include no chest pain and no polyphagia. Symptoms are worsening. Diabetic complications include peripheral neuropathy and PVD. Risk factors for coronary artery disease include diabetes mellitus, dyslipidemia, hypertension, obesity and sedentary lifestyle. Current diabetic treatment includes insulin injections and oral agent (monotherapy). Compliance with diabetes treatment: She is accompanied by her husband who is offering no help and her caseworker. Her weight is fluctuating minimally. She is following a generally unhealthy diet. When asked about meal planning, she reported none. She has had a previous visit with a dietitian. She never  participates in exercise. There is no change in her home blood glucose trend. Her overall blood glucose range is >200 mg/dl. ( Her previsit labs show A1c of 9.6% largely unchanged from her last visit A1c of 10%.  ) An ACE inhibitor/angiotensin II receptor blocker is being taken.  Hyperlipidemia  This is a chronic problem. The current episode started more than 1 year ago. Exacerbating diseases include diabetes and obesity. Pertinent negatives include no chest pain, myalgias or shortness of breath. Risk factors for coronary artery disease include dyslipidemia, diabetes mellitus and hypertension.  Hypertension  This is a chronic problem. The current episode started more than 1 year ago. Pertinent negatives include no chest pain, headaches, palpitations or shortness of breath. Risk factors for coronary artery disease include dyslipidemia, diabetes mellitus and sedentary lifestyle. Past treatments include angiotensin blockers. Hypertensive end-organ damage includes CAD/MI and PVD.    Review of Systems  Constitutional: Positive for fatigue. Negative for unexpected weight change.  HENT: Negative for trouble swallowing and voice change.   Eyes: Negative for visual disturbance.  Respiratory: Negative for cough, shortness of breath and wheezing.    Cardiovascular: Negative for chest pain, palpitations and leg swelling.  Gastrointestinal: Negative for diarrhea, nausea and vomiting.  Endocrine: Positive for polydipsia and polyuria. Negative for cold intolerance, heat intolerance and polyphagia.  Musculoskeletal: Positive for gait problem. Negative for arthralgias and myalgias.       She is using a walker to get around.  Skin: Negative for color change, pallor, rash and wound.  Neurological: Negative for seizures and headaches.  Psychiatric/Behavioral: Negative for confusion and suicidal ideas.    Objective:    BP 118/62   Pulse 74   Ht 5' 1.5" (1.562 m)   BMI 38.85 kg/m   Wt Readings from Last 3 Encounters:  08/16/17 209 lb (94.8 kg)  07/27/17 228 lb (103.4 kg)  07/13/17 215 lb (97.5 kg)    Physical Exam  Constitutional: She is oriented to person, place, and time. She appears well-developed.  HENT:  Head: Normocephalic and atraumatic.  Eyes: EOM are normal.  Neck: Normal range of motion. Neck supple. No tracheal deviation present. No thyromegaly present.  Cardiovascular: Normal rate and regular rhythm.  Pulmonary/Chest: Effort normal and breath sounds normal.  Abdominal: Soft. Bowel sounds are normal. There is no tenderness. There is no guarding.  Musculoskeletal: She exhibits no edema.  She is using a walker to get around. She is out of the wheelchair that she used during last visit.  Neurological: She is alert and oriented to person, place, and time. She has normal reflexes. No cranial nerve deficit. Coordination normal.  Skin: Skin is warm and dry. No rash noted. No erythema. No pallor.  Psychiatric: She has a normal mood and affect. Judgment normal.  Reluctant affect.    Results for orders placed or performed in visit on 10/18/17  Basic metabolic panel  Result Value Ref Range   BUN 13 4 - 21   Creatinine 1.3 (A) 0.5 - 1.1  Hemoglobin A1c  Result Value Ref Range   Hemoglobin A1C 9.6    Complete Blood Count  (Most recent): Lab Results  Component Value Date   WBC 4.2 06/22/2016   HGB 13.8 06/22/2016   HCT 41.4 06/22/2016   MCV 87 06/22/2016   PLT 283 06/22/2016   Chemistry (most recent): Lab Results  Component Value Date   NA 137 07/05/2017   K 4.5 07/05/2017   CL 103 07/05/2017   CO2 19 (L)  07/05/2017   BUN 13 10/11/2017   CREATININE 1.3 (A) 10/11/2017   Diabetic Labs (most recent): Lab Results  Component Value Date   HGBA1C 9.6 10/11/2017   HGBA1C 10.0 (H) 07/05/2017   HGBA1C 8.3 01/19/2017   Assessment & Plan:   1. Type 2 diabetes mellitus with vascular disease (HCC)  Her diabetes is  complicated by coronary artery disease, CKD, deconditioning, limited social support. Patient came with 3 different meters showing rare and random blood glucose monitoring averaging greater than 200 mg/dL.  Her blood glucose reading in the clinic was 298 postprandial.   -Her previsit labs show A1c of 9.6% unchanged from her last visit A1c of 10%.    -  Recent labs reviewed, showing stage III renal insufficiency. -She is accompanied by her husband for first time to clinic, he is not offering any help. - Patient remains at a high risk for more acute and chronic complications of diabetes which include CAD, CVA, CKD, retinopathy, and neuropathy.  -She is also at high risk for hypoglycemia from improper use of insulin.   These are all discussed in detail with the patient, her caseworker, and her husband.  - Patient is advised to stick to a routine mealtimes to eat 3 meals  a day and avoid unnecessary snacks ( to snack only to correct hypoglycemia).  -She still admits to dietary indiscretion. -  Suggestion is made for her to avoid simple carbohydrates  from her diet including Cakes, Sweet Desserts / Pastries, Ice Cream, Soda (diet and regular), Sweet Tea, Candies, Chips, Cookies, Store Bought Juices, Alcohol in Excess of  1-2 drinks a day, Artificial Sweeteners, and "Sugar-free" Products. This will  help patient to have stable blood glucose profile and potentially avoid unintended weight gain.  - I have approached patient with the following individualized plan to manage diabetes and patient agrees.  -  The #1 goal in her therapy will be avoiding hypoglycemia.  - Given her significant social problem, nonexistent social support, with no support available for her,  she is not reliable for safe use of insulin. -According to her social worker, this arrangement cannot be made available for her at home at this time.   -Ideal scenario for her would be placement in group home where she could be cared for by skilled personnel including insulin administration. -Her caseworker says that he will initiate this process with her social worker. -In the meantime, I will continue Januvia 100 mg p.o. daily with breakfast.    -She does not tolerate metformin, she has CKD ; she has sulfa allergy. -  On prior attempts, she did not use insulin safely .  - Patient specific target  for A1c; LDL, HDL, Triglycerides, and  Waist Circumference were discussed in detail.  2) BP/HTN: Her blood pressure is controlled to target .  She is advised to continue current medications including ARB.  3) Lipids/HPL: continue statins. 4)  Weight/Diet: CDE consult in progress, exercise, and carbohydrates information provided.  5) hypothyroidism:    - I advised her caseworker to coordinate her medications to include levothyroxine at 125 g by mouth every morning.  - We discussed about correct intake of levothyroxine, at fasting, with water, separated by at least 30 minutes from breakfast, and separated by more than 4 hours from calcium, iron, multivitamins, acid reflux medications (PPIs). -Patient is made aware of the fact that thyroid hormone replacement is needed for life, dose to be adjusted by periodic monitoring of thyroid function tests.  6)  Chronic Care/Health Maintenance:  -Patient is ARB and Statin medications and  encouraged to continue to follow up with Ophthalmology, Podiatrist at least yearly or according to recommendations, and advised to  stay away from smoking. I have recommended yearly flu vaccine and pneumonia vaccination at least every 5 years;  and  sleep for at least 7 hours a day.   I advised patient to maintain close follow up with their PCP for primary care needs.  - Time spent with the patient: 25 min, of which >50% was spent in reviewing her blood glucose logs , discussing her hypo- and hyper-glycemic episodes, reviewing her current and  previous labs and insulin doses and developing a plan to avoid hypo- and hyper-glycemia. Please refer to Patient Instructions for Blood Glucose Monitoring and Insulin/Medications Dosing Guide"  in media tab for additional information. Karen Mueller participated in the discussions, expressed understanding, and voiced agreement with the above plans.  she is encouraged to contact clinic should she have any questions or concerns prior to her return visit.  Follow up plan: Return in about 4 months (around 02/17/2018) for follow up with pre-visit labs.  Marquis Lunch, MD Phone: [phone]  Fax: [phone]   10/18/2017, 1:08 PM

## 2017-10-24 ENCOUNTER — Telehealth: Payer: Self-pay

## 2017-10-24 NOTE — Telephone Encounter (Signed)
Received a refill request from [facility] [place], [place] for Dicyclomine , take 1 capsule po before meals and at bedtime as needed for cramps/diarrhea.

## 2017-10-27 ENCOUNTER — Ambulatory Visit: Payer: 59 | Admitting: "Endocrinology

## 2017-10-27 ENCOUNTER — Ambulatory Visit: Payer: 59 | Admitting: Nutrition

## 2017-10-28 ENCOUNTER — Telehealth: Payer: Self-pay

## 2017-10-28 NOTE — Telephone Encounter (Signed)
efill request for Dicyclomine   # 120, take 1 capsule po before meals and at bedtime received by [facility] [place], [place].

## 2017-10-31 MED ORDER — DICYCLOMINE HCL 10 MG PO CAPS: ORAL_CAPSULE | ORAL | 3 refills | Status: AC

## 2017-10-31 NOTE — Telephone Encounter (Signed)
Done today.

## 2017-10-31 NOTE — Telephone Encounter (Signed)
Done

## 2017-10-31 NOTE — Addendum Note (Signed)
Addended by: Gelene Mink on: 10/31/2017 01:31 PM   Modules accepted: Orders

## 2017-11-02 ENCOUNTER — Encounter: Payer: Self-pay | Admitting: Gastroenterology

## 2017-11-02 ENCOUNTER — Telehealth: Payer: Self-pay | Admitting: Gastroenterology

## 2017-11-02 ENCOUNTER — Ambulatory Visit: Payer: 59 | Admitting: Gastroenterology

## 2017-11-02 NOTE — Telephone Encounter (Signed)
PATIENT WAS A NO SHOW AND LETTER SENT  °

## 2017-12-01 ENCOUNTER — Ambulatory Visit: Payer: 59 | Admitting: "Endocrinology

## 2017-12-12 ENCOUNTER — Other Ambulatory Visit: Payer: Self-pay

## 2017-12-12 MED ORDER — DOCUSATE SODIUM 100 MG PO CAPS: 100.0000 mg | ORAL_CAPSULE | Freq: Two times a day (BID) | ORAL | 3 refills | Status: AC

## 2018-02-03 ENCOUNTER — Encounter: Payer: Self-pay | Admitting: "Endocrinology

## 2018-02-20 ENCOUNTER — Ambulatory Visit: Payer: 59 | Admitting: "Endocrinology

## 2018-02-24 ENCOUNTER — Encounter: Payer: Self-pay | Admitting: Internal Medicine

## 2018-02-24 ENCOUNTER — Telehealth: Payer: Self-pay | Admitting: Gastroenterology

## 2018-02-24 ENCOUNTER — Ambulatory Visit: Payer: 59 | Admitting: Gastroenterology

## 2018-02-24 NOTE — Telephone Encounter (Signed)
PATIENT WAS A NO SHOW AND LETTER SENT  °

## 2018-10-30 IMAGING — NM NM GASTRIC EMPTYING
8 series · 8 of 8 positions shown · non-contrast
Comparison: None.

CLINICAL DATA: Non intractable vomiting with nausea.

EXAM:
NUCLEAR MEDICINE GASTRIC EMPTYING SCAN
TECHNIQUE: After oral ingestion of radiolabeled meal, sequential abdominal
images were obtained for 4 hours. Percentage of activity emptying
the stomach was calculated at 1 hour, 2 hour, 3 hour, and 4 hours.
RADIOPHARMACEUTICALS:  Two mCi Hc-22m sulfur colloid in standardized
meal

[Series 1: 0 min · 4.14mm/px · 1 of 1 slices shown (1 of 2)]
[im 1/1]
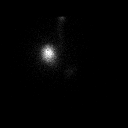

[Series 1: 0 min · 4.14mm/px · 1 of 1 slices shown (2 of 2)]
[im 1/1]
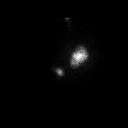

[Series 2: 60 min · 4.14mm/px · 1 of 1 slices shown (1 of 2)]
[im 1/1  full-range]
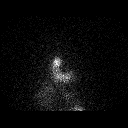

[Series 2: 60 min · 4.14mm/px · 1 of 1 slices shown (2 of 2)]
[im 1/1]
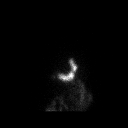

[Series 3: 120 min · 4.14mm/px · 1 of 1 slices shown (1 of 2)]
[im 1/1]
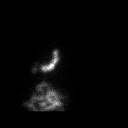

[Series 3: 120 min · 4.14mm/px · 1 of 1 slices shown (2 of 2)]
[im 1/1  full-range]
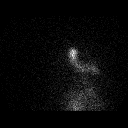

[Series 4: 180 min · 4.14mm/px · 1 of 1 slices shown (1 of 2)]
[im 1/1]
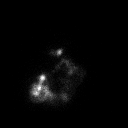

[Series 4: 180 min · 4.14mm/px · 1 of 1 slices shown (2 of 2)]
[im 1/1  full-range]
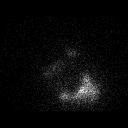

[8 of 8 positions shown; findings below may reference images not displayed]

FINDINGS: Expected location of the stomach in the left upper quadrant.
Ingested meal empties the stomach gradually over the course of the
study. Esophageal activity on the initial image cleared and did not
recur.

22% emptied at 1 hr ( normal >= 10%)

45% emptied at 2 hr ( normal >= 40%)

56% emptied at 3 hr ( normal >= 70%)

87% emptied at 4 hr ( normal >= 90%)
IMPRESSION: Mildly delayed gastric emptying at 3 and 4 hours, as above.

## 2024-05-28 DEATH — deceased
